# Patient Record
Sex: Female | Born: 1980 | Race: White | Hispanic: No | Marital: Married | Smoking: Never smoker
Health system: Southern US, Community
[De-identification: ages and names within clinical notes are randomized; demographics above are authoritative.]

## PROBLEM LIST (undated history)

## (undated) DIAGNOSIS — R112 Nausea with vomiting, unspecified: Secondary | ICD-10-CM

## (undated) DIAGNOSIS — M5136 Other intervertebral disc degeneration, lumbar region: Secondary | ICD-10-CM

## (undated) DIAGNOSIS — M51369 Other intervertebral disc degeneration, lumbar region without mention of lumbar back pain or lower extremity pain: Secondary | ICD-10-CM

## (undated) DIAGNOSIS — M47819 Spondylosis without myelopathy or radiculopathy, site unspecified: Secondary | ICD-10-CM

## (undated) DIAGNOSIS — M4310 Spondylolisthesis, site unspecified: Secondary | ICD-10-CM

## (undated) DIAGNOSIS — Z9889 Other specified postprocedural states: Secondary | ICD-10-CM

## (undated) DIAGNOSIS — M545 Low back pain, unspecified: Secondary | ICD-10-CM

## (undated) DIAGNOSIS — R200 Anesthesia of skin: Secondary | ICD-10-CM

## (undated) HISTORY — DX: Other intervertebral disc degeneration, lumbar region without mention of lumbar back pain or lower extremity pain: M51.369

## (undated) HISTORY — DX: Other intervertebral disc degeneration, lumbar region: M51.36

## (undated) HISTORY — DX: Spondylosis without myelopathy or radiculopathy, site unspecified: M47.819

## (undated) HISTORY — PX: CYST REMOVAL HAND: SHX6279

## (undated) HISTORY — DX: Spondylolisthesis, site unspecified: M43.10

## (undated) HISTORY — PX: BACK SURGERY: SHX140

## (undated) HISTORY — PX: WISDOM TOOTH EXTRACTION: SHX21

## (undated) HISTORY — DX: Anesthesia of skin: R20.0

## (undated) HISTORY — DX: Low back pain, unspecified: M54.50

---
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
## 2000-03-20 ENCOUNTER — Emergency Department (HOSPITAL_COMMUNITY): Admission: EM | Admit: 2000-03-20 | Discharge: 2000-03-21 | Payer: Self-pay

## 2002-02-14 ENCOUNTER — Encounter: Payer: Self-pay | Admitting: Emergency Medicine

## 2002-02-14 ENCOUNTER — Emergency Department (HOSPITAL_COMMUNITY): Admission: EM | Admit: 2002-02-14 | Discharge: 2002-02-14 | Payer: Self-pay | Admitting: Emergency Medicine

## 2002-03-15 ENCOUNTER — Emergency Department (HOSPITAL_COMMUNITY): Admission: EM | Admit: 2002-03-15 | Discharge: 2002-03-16 | Payer: Self-pay | Admitting: Emergency Medicine

## 2005-12-31 ENCOUNTER — Ambulatory Visit (HOSPITAL_COMMUNITY): Admission: RE | Admit: 2005-12-31 | Discharge: 2005-12-31 | Payer: Self-pay | Admitting: Neurosurgery

## 2018-10-30 ENCOUNTER — Other Ambulatory Visit: Payer: Self-pay | Admitting: Neurosurgery

## 2018-10-31 ENCOUNTER — Other Ambulatory Visit: Payer: Self-pay | Admitting: *Deleted

## 2018-11-02 ENCOUNTER — Encounter: Payer: Self-pay | Admitting: Vascular Surgery

## 2018-11-02 DIAGNOSIS — R200 Anesthesia of skin: Secondary | ICD-10-CM

## 2018-11-02 DIAGNOSIS — M545 Low back pain, unspecified: Secondary | ICD-10-CM

## 2018-11-08 ENCOUNTER — Ambulatory Visit: Payer: Self-pay | Admitting: Vascular Surgery

## 2018-11-08 ENCOUNTER — Other Ambulatory Visit: Payer: Self-pay | Admitting: *Deleted

## 2018-11-09 ENCOUNTER — Encounter: Payer: Self-pay | Admitting: Family

## 2018-11-14 ENCOUNTER — Telehealth (HOSPITAL_COMMUNITY): Payer: Self-pay | Admitting: Rehabilitation

## 2018-11-14 NOTE — Telephone Encounter (Signed)
The above patient or their representative was contacted and gave the following answers to these questions:         Do you have any of the following symptoms? No Fever                    Cough                   Shortness of breath  Do  you have any of the following other symptoms? No  muscle pain         vomiting,        diarrhea        rash         weakness        red eye        abdominal pain         bruising         bleeding              joint pain           severe headache  Have you been in contact with someone who was or has been sick in the past 2 weeks? No Yes                 Unsure                         Unable to assess   Does the person that you were in contact with have any of the following symptoms?  Cough         shortness of breath           muscle pain         vomiting,            diarrhea            rash            weakness           fever            red eye           abdominal pain          bruising  or  bleeding                joint pain                severe headache             Have you  or someone you have been in contact with traveled internationally in the last month?  No      If yes, which countries?  Have you  or someone you have been in contact with traveled outside [place] in the last month?  No      If yes, which state and city?  COMMENTS OR ACTION PLAN FOR THIS PATIENT:    

## 2018-11-15 ENCOUNTER — Ambulatory Visit (INDEPENDENT_AMBULATORY_CARE_PROVIDER_SITE_OTHER): Payer: BC Managed Care – PPO | Admitting: Vascular Surgery

## 2018-11-15 ENCOUNTER — Other Ambulatory Visit: Payer: Self-pay

## 2018-11-15 ENCOUNTER — Encounter: Payer: Self-pay | Admitting: Vascular Surgery

## 2018-11-15 VITALS — BP 123/88 | HR 100 | Temp 98.0°F | Resp 20 | Ht 64.0 in | Wt 130.7 lb

## 2018-11-15 DIAGNOSIS — M5136 Other intervertebral disc degeneration, lumbar region: Secondary | ICD-10-CM | POA: Diagnosis not present

## 2018-11-15 DIAGNOSIS — M5137 Other intervertebral disc degeneration, lumbosacral region: Secondary | ICD-10-CM

## 2018-11-15 NOTE — Progress Notes (Signed)
REASON FOR CONSULT:    To evaluate for retroperitoneal exposure of L5-S1.  The consult is requested by Dr. Annette Stable.  ASSESSMENT & PLAN:   DEGENERATIVE DISC DISEASE L5-S1: This patient appears to be a good candidate for anterior retroperitoneal exposure of L5-S1.  I reviewed her MRI and do not see any complicating features. I have reviewed our role in exposure of the spine in order to allow anterior lumbar interbody fusion at the appropriate levels. We have discussed the potential complications of surgery, including but not limited to, arterial or venous injury, thrombosis, or bleeding. We have also discussed the potential risks of wound healing problems, the development of a hernia, nerve injury, leg swelling, or other unpredictable medical problems. All the patient's questions were answered and they are agreeable to proceed.  Her surgery is scheduled for 11/21/2018.  [name], MD, FACS Beeper [phone] Office: [phone]   HPI:   Andrea Harper is a pleasant 38 y.o. female, with a long history of low back pain.  She played soccer in college and does not remember any specific injury but developed chronic low back pain.  She is had 2 previous operations through the back.  She is failed conservative treatment and now presents for anterior lumbar interbody fusion.  She has no risk factors for peripheral vascular disease.  Specifically, she denies diabetes, hypertension, hypercholesterolemia, any family history of premature cardiovascular disease or tobacco use.  She said no claudication or rest pain.  She has no cardiac history.  Past Medical History:  Diagnosis Date  . 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  Marked disc degeneration with disc space collapse.    . Disc degeneration, lumbar    Broad-based disc bulge at L5-S1 level.  Significant Foraminal stenosis.  . Facet hypertrophy    At L4-5 with mild canal and Bilat L5 foraminal stenosis.   . Low back pain   . Numbness     and pain Into Left buttocks, posterior thigh, and posterior lateral leg.    Marland Kitchen Retrolisthesis    4 mm of L5 S1, Grade 1.  More prominent with extension.    History reviewed. No pertinent family history.  SOCIAL HISTORY: She is not a smoker. Social History   Socioeconomic History  . Marital status: Married    Spouse name: Not on file  . Number of children: Not on file  . Years of education: Not on file  . Highest education level: Not on file  Occupational History  . 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 Financial resource strain: Not on file  . Food insecurity    Worry: Not on file    Inability: Not on file  . Transportation needs    Medical: Not on file    Non-medical: Not on file  Tobacco Use  . Smoking status: Never Smoker  . Smokeless tobacco: Never Used  Substance and Sexual Activity  . Alcohol use: Not Currently  . Drug use: Not Currently  . Sexual activity: Not on file  Lifestyle  . Physical activity    Days per week: Not on file    Minutes per session: Not on file  . Stress: Not on file  Relationships  . Social Herbalist on phone: Not on file    Gets together: Not on file    Attends religious service: Not on file    Active member of club or organization: Not on file  Attends meetings of clubs or organizations: Not on file    Relationship status: Not on file  . Intimate partner violence    Fear of current or ex partner: Not on file    Emotionally abused: Not on file    Physically abused: Not on file    Forced sexual activity: Not on file  Other Topics Concern  . Not on file  Social History Narrative  . Not on file    Allergies  Allergen Reactions  . Erythromycin Base     Childhood allergy, unknown reaction    Current Outpatient Medications  Medication Sig Dispense Refill  . Biotin 5 MG TABS Take 5 mg by mouth daily.    . cyclobenzaprine (FLEXERIL) 10 MG tablet Take 10 mg by mouth 3 (three) times  daily as needed.     . Multiple Vitamin (MULTIVITAMIN WITH MINERALS) TABS tablet Take 1 tablet by mouth daily.     No current facility-administered medications for this visit.     REVIEW OF SYSTEMS:  [X]  denotes positive finding, [ ]  denotes negative finding Cardiac  Comments:  Chest pain or chest pressure:    Shortness of breath upon exertion:    Short of breath when lying flat:    Irregular heart rhythm:        Vascular    Pain in calf, thigh, or hip brought on by ambulation:    Pain in feet at night that wakes you up from your sleep:     Blood clot in your veins:    Leg swelling:         Pulmonary    Oxygen at home:    Productive cough:     Wheezing:         Neurologic    Sudden weakness in arms or legs:     Sudden numbness in arms or legs:     Sudden onset of difficulty speaking or slurred speech:    Temporary loss of vision in one eye:     Problems with dizziness:         Gastrointestinal    Blood in stool:     Vomited blood:         Genitourinary    Burning when urinating:     Blood in urine:        Psychiatric    Major depression:         Hematologic    Bleeding problems:    Problems with blood clotting too easily:        Skin    Rashes or ulcers:        Constitutional    Fever or chills:     PHYSICAL EXAM:   Vitals:   11/15/18 0845  BP: 123/88  Pulse: 100  Resp: 20  Temp: 98 F (36.7 C)  SpO2: 100%  Weight: 130 lb 11.2 oz (59.3 kg)  Height: 5\' 4"  (1.626 m)    GENERAL: The patient is a well-nourished female, in no acute distress. The vital signs are documented above. CARDIAC: There is a regular rate and rhythm.  VASCULAR: I do not detect carotid bruits. She has palpable dorsalis pedis and posterior tibial pulses bilaterally. She has no significant lower extremity swelling. PULMONARY: There is good air exchange bilaterally without wheezing or rales. ABDOMEN: Soft and non-tender with normal pitched bowel sounds.  MUSCULOSKELETAL: There  are no major deformities or cyanosis. NEUROLOGIC: No focal weakness or paresthesias are detected. SKIN: There are no ulcers or rashes  noted. PSYCHIATRIC: The patient has a normal affect.  DATA:    MRI: She brought a disc with her MRI today and I reviewed these images with her.  I do not see any complicating factors as related to exposure of the L5-S1 disc space.

## 2018-11-15 NOTE — H&P (View-Only) (Signed)
REASON FOR CONSULT:    To evaluate for retroperitoneal exposure of L5-S1.  The consult is requested by Dr. Annette Stable.  ASSESSMENT & PLAN:   DEGENERATIVE DISC DISEASE L5-S1: This patient appears to be a good candidate for anterior retroperitoneal exposure of L5-S1.  I reviewed her MRI and do not see any complicating features. I have reviewed our role in exposure of the spine in order to allow anterior lumbar interbody fusion at the appropriate levels. We have discussed the potential complications of surgery, including but not limited to, arterial or venous injury, thrombosis, or bleeding. We have also discussed the potential risks of wound healing problems, the development of a hernia, nerve injury, leg swelling, or other unpredictable medical problems. All the patient's questions were answered and they are agreeable to proceed.  Her surgery is scheduled for 11/21/2018.  [name], MD, FACS Beeper [phone] Office: [phone]   HPI:   Andrea Harper is a pleasant 38 y.o. female, with a long history of low back pain.  She played soccer in college and does not remember any specific injury but developed chronic low back pain.  She is had 2 previous operations through the back.  She is failed conservative treatment and now presents for anterior lumbar interbody fusion.  She has no risk factors for peripheral vascular disease.  Specifically, she denies diabetes, hypertension, hypercholesterolemia, any family history of premature cardiovascular disease or tobacco use.  She said no claudication or rest pain.  She has no cardiac history.  Past Medical History:  Diagnosis Date  . 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  Marked disc degeneration with disc space collapse.    . Disc degeneration, lumbar    Broad-based disc bulge at L5-S1 level.  Significant Foraminal stenosis.  . Facet hypertrophy    At L4-5 with mild canal and Bilat L5 foraminal stenosis.   . Low back pain   . Numbness     and pain Into Left buttocks, posterior thigh, and posterior lateral leg.    Marland Kitchen Retrolisthesis    4 mm of L5 S1, Grade 1.  More prominent with extension.    History reviewed. No pertinent family history.  SOCIAL HISTORY: She is not a smoker. Social History   Socioeconomic History  . Marital status: Married    Spouse name: Not on file  . Number of children: Not on file  . Years of education: Not on file  . Highest education level: Not on file  Occupational History  . 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 Financial resource strain: Not on file  . Food insecurity    Worry: Not on file    Inability: Not on file  . Transportation needs    Medical: Not on file    Non-medical: Not on file  Tobacco Use  . Smoking status: Never Smoker  . Smokeless tobacco: Never Used  Substance and Sexual Activity  . Alcohol use: Not Currently  . Drug use: Not Currently  . Sexual activity: Not on file  Lifestyle  . Physical activity    Days per week: Not on file    Minutes per session: Not on file  . Stress: Not on file  Relationships  . Social Herbalist on phone: Not on file    Gets together: Not on file    Attends religious service: Not on file    Active member of club or organization: Not on file  Attends meetings of clubs or organizations: Not on file    Relationship status: Not on file  . Intimate partner violence    Fear of current or ex partner: Not on file    Emotionally abused: Not on file    Physically abused: Not on file    Forced sexual activity: Not on file  Other Topics Concern  . Not on file  Social History Narrative  . Not on file    Allergies  Allergen Reactions  . Erythromycin Base     Childhood allergy, unknown reaction    Current Outpatient Medications  Medication Sig Dispense Refill  . Biotin 5 MG TABS Take 5 mg by mouth daily.    . cyclobenzaprine (FLEXERIL) 10 MG tablet Take 10 mg by mouth 3 (three) times  daily as needed.     . Multiple Vitamin (MULTIVITAMIN WITH MINERALS) TABS tablet Take 1 tablet by mouth daily.     No current facility-administered medications for this visit.     REVIEW OF SYSTEMS:  [X]  denotes positive finding, [ ]  denotes negative finding Cardiac  Comments:  Chest pain or chest pressure:    Shortness of breath upon exertion:    Short of breath when lying flat:    Irregular heart rhythm:        Vascular    Pain in calf, thigh, or hip brought on by ambulation:    Pain in feet at night that wakes you up from your sleep:     Blood clot in your veins:    Leg swelling:         Pulmonary    Oxygen at home:    Productive cough:     Wheezing:         Neurologic    Sudden weakness in arms or legs:     Sudden numbness in arms or legs:     Sudden onset of difficulty speaking or slurred speech:    Temporary loss of vision in one eye:     Problems with dizziness:         Gastrointestinal    Blood in stool:     Vomited blood:         Genitourinary    Burning when urinating:     Blood in urine:        Psychiatric    Major depression:         Hematologic    Bleeding problems:    Problems with blood clotting too easily:        Skin    Rashes or ulcers:        Constitutional    Fever or chills:     PHYSICAL EXAM:   Vitals:   11/15/18 0845  BP: 123/88  Pulse: 100  Resp: 20  Temp: 98 F (36.7 C)  SpO2: 100%  Weight: 130 lb 11.2 oz (59.3 kg)  Height: 5\' 4"  (1.626 m)    GENERAL: The patient is a well-nourished female, in no acute distress. The vital signs are documented above. CARDIAC: There is a regular rate and rhythm.  VASCULAR: I do not detect carotid bruits. She has palpable dorsalis pedis and posterior tibial pulses bilaterally. She has no significant lower extremity swelling. PULMONARY: There is good air exchange bilaterally without wheezing or rales. ABDOMEN: Soft and non-tender with normal pitched bowel sounds.  MUSCULOSKELETAL: There  are no major deformities or cyanosis. NEUROLOGIC: No focal weakness or paresthesias are detected. SKIN: There are no ulcers or rashes  noted. PSYCHIATRIC: The patient has a normal affect.  DATA:    MRI: She brought a disc with her MRI today and I reviewed these images with her.  I do not see any complicating factors as related to exposure of the L5-S1 disc space.   

## 2018-11-16 NOTE — Progress Notes (Signed)
[id number] - [place], [place] - [address] [place] [id number] Phone: [phone] Fax: [phone]      Your procedure is scheduled on Tuesday, 6/23.  [facility] Main Entr[facility] "A" at 5:30 A.M., and check in at the Admitting office.  Call this number if you have problems the morning of surgery:  [phone]  Call [phone] if you have any questions prior to your surgery date Monday-Friday 8am-4pm    Remember:  Do not eat or drink after midnight Monday.    Take these medicines the morning of surgery with A SIP OF WATER: None   STOP now taking any Aspirin (unless otherwise instructed by your surgeon), Aleve, Naproxen, Ibuprofen, Motrin, Advil, Goody's, BC's, all herbal medications, fish oil, and all vitamins.    The Morning of Surgery  Do not wear jewelry, make-up or nail polish.  Do not wear lotions, powders, or perfumes/colognes, or deodorant  Do not shave 48 hours prior to surgery.    Do not bring valuables to the hospital.  [facility] is not responsible for any belongings or valuables.  If you are a smoker, DO NOT Smoke 24 hours prior to surgery   IF you wear a CPAP at night please bring your mask, tubing, and machine the morning of surgery   Remember that you must have someone to transport you home after your surgery, and remain with you for 24 hours if you are discharged the same day.  Patients discharged the day of surgery will not be allowed to drive home.   Contacts, glasses, hearing aids, dentures or bridgework may not be worn into surgery.   For patients admitted to the hospital, discharge time will be determined by your treatment team.   Special instructions:   [place]- Preparing For Surgery  Before surgery, you can play an important role. Because skin is not sterile, your skin needs to be as free of germs as possible. You can reduce the number of germs on your  skin by washing with CHG (chlorahexidine gluconate) Soap before surgery.  CHG is an antiseptic cleaner which kills germs and bonds with the skin to continue killing germs even after washing.    Oral Hygiene is also important to reduce your risk of infection.  Remember - BRUSH YOUR TEETH THE MORNING OF SURGERY WITH YOUR REGULAR TOOTHPASTE  Please do not use if you have an allergy to CHG or antibacterial soaps. If your skin becomes reddened/irritated stop using the CHG.  Do not shave (including legs and underarms) for at least 48 hours prior to first CHG shower. It is OK to shave your face.  Please follow these instructions carefully.   1. Shower the Barnes & NobleGHT BEFORE SURGERY (Mon) and the MORNING OF SURGERY (Tues) with CHG Soap.   2. If you chose to wash your hair, wash your hair first as usual with your normal shampoo.  3. After you shampoo, rinse your hair and body thoroughly to remove the shampoo.  4. Use CHG as you would any other liquid soap. You can apply CHG directly to the skin and wash gently with a scrungie or a clean washcloth.   5. Apply the CHG Soap to your body ONLY FROM THE NECK DOWN.  Do not use on open wounds or open sores. Avoid contact with your eyes, ears, mouth and genitals (private parts). Wash Face and genitals (private parts)  with your  normal soap.   6. Wash thoroughly, paying special attention to the area where your surgery will be performed.  7. Thoroughly rinse your body with warm water from the neck down.  8. DO NOT shower/wash with your normal soap after using and rinsing off the CHG Soap.  9. Pat yourself dry with a CLEAN TOWEL.  10. Wear CLEAN PAJAMAS to bed the night before surgery, wear comfortable clothes the morning of surgery  11. Place CLEAN SHEETS on your bed the night of your first shower and DO NOT SLEEP WITH PETS.    Day of Surgery:  Do not apply any deodorants/lotions.  Please wear clean clothes to the hospital/surgery center.   Remember to  brush your teeth WITH YOUR REGULAR TOOTHPASTE.   Please read over the following fact sheets that you were given.

## 2018-11-17 ENCOUNTER — Ambulatory Visit (HOSPITAL_COMMUNITY)
Admission: RE | Admit: 2018-11-17 | Discharge: 2018-11-17 | Disposition: A | Discharge status: Home / Self Care | Payer: BC Managed Care – PPO | Source: Ambulatory Visit | Attending: Neurosurgery | Admitting: Neurosurgery

## 2018-11-17 ENCOUNTER — Other Ambulatory Visit: Payer: Self-pay

## 2018-11-17 ENCOUNTER — Other Ambulatory Visit (HOSPITAL_COMMUNITY)
Admission: RE | Admit: 2018-11-17 | Discharge: 2018-11-17 | Disposition: A | Discharge status: Home / Self Care | Payer: BC Managed Care – PPO | Source: Ambulatory Visit | Attending: Neurosurgery | Admitting: Neurosurgery

## 2018-11-17 ENCOUNTER — Encounter (HOSPITAL_COMMUNITY)
Admission: RE | Admit: 2018-11-17 | Discharge: 2018-11-17 | Disposition: A | Discharge status: Home / Self Care | Payer: BC Managed Care – PPO | Source: Ambulatory Visit | Attending: Neurosurgery | Admitting: Neurosurgery

## 2018-11-17 ENCOUNTER — Encounter (HOSPITAL_COMMUNITY): Payer: Self-pay

## 2018-11-17 DIAGNOSIS — Z1159 Encounter for screening for other viral diseases: Secondary | ICD-10-CM | POA: Diagnosis not present

## 2018-11-17 DIAGNOSIS — M4310 Spondylolisthesis, site unspecified: Secondary | ICD-10-CM | POA: Diagnosis not present

## 2018-11-17 HISTORY — DX: Other specified postprocedural states: R11.2

## 2018-11-17 HISTORY — DX: Other specified postprocedural states: Z98.890

## 2018-11-17 LAB — CBC WITH DIFFERENTIAL/PLATELET
Abs Immature Granulocytes: 0.01 10*3/uL (ref 0.00–0.07)
Basophils Absolute: 0 10*3/uL (ref 0.0–0.1)
Basophils Relative: 0 %
Eosinophils Absolute: 0.1 10*3/uL (ref 0.0–0.5)
Eosinophils Relative: 1 %
HCT: 39.5 % (ref 36.0–46.0)
Hemoglobin: 13.3 g/dL (ref 12.0–15.0)
Immature Granulocytes: 0 %
Lymphocytes Relative: 27 %
Lymphs Abs: 1.9 10*3/uL (ref 0.7–4.0)
MCH: 30 pg (ref 26.0–34.0)
MCHC: 33.7 g/dL (ref 30.0–36.0)
MCV: 89.2 fL (ref 80.0–100.0)
Monocytes Absolute: 0.6 10*3/uL (ref 0.1–1.0)
Monocytes Relative: 9 %
Neutro Abs: 4.4 10*3/uL (ref 1.7–7.7)
Neutrophils Relative %: 63 %
Platelets: 253 10*3/uL (ref 150–400)
RBC: 4.43 MIL/uL (ref 3.87–5.11)
RDW: 11.8 % (ref 11.5–15.5)
WBC: 7.1 10*3/uL (ref 4.0–10.5)
nRBC: 0 % (ref 0.0–0.2)

## 2018-11-17 LAB — SURGICAL PCR SCREEN
MRSA, PCR: NEGATIVE
Staphylococcus aureus: NEGATIVE

## 2018-11-17 LAB — TYPE AND SCREEN
ABO/RH(D): A POS
Antibody Screen: NEGATIVE

## 2018-11-17 LAB — SARS CORONAVIRUS 2 (TAT 6-24 HRS): SARS Coronavirus 2: NEGATIVE

## 2018-11-17 NOTE — Progress Notes (Addendum)
PCP - Polo Riley, OBGYN Cardiologist - patient denies  Chest x-ray - 11/17/2018 EKG - 11/17/2018 Stress Test - patient denies ECHO - patient denies Cardiac Cath - patient denies  Sleep Study - patient denies CPAP -   Fasting Blood Sugar - patient denies Checks Blood Sugar _____ times a day  Blood Thinner Instructions: n/a Aspirin Instructions:n/a  Anesthesia review: n/a  Patient denies shortness of breath, fever, cough and chest pain at PAT appointment. Patient's HR was slightly elevated in the 100's.  HR 104 on EKG.  Jeneen Rinks, APP notified.  Coronavirus Screening  Have you experienced the following symptoms:  Cough yes/no: No Fever (>100.50F)  yes/no: No Runny nose yes/no: No Sore throat yes/no: No Difficulty breathing/shortness of breath  yes/no: No  Have you or a family member traveled in the last 14 days and where? yes/no: No   If the patient indicates "YES" to the above questions, their PAT will be rescheduled to limit the exposure to others and, the surgeon will be notified. THE PATIENT WILL NEED TO BE ASYMPTOMATIC FOR 14 DAYS.   If the patient is not experiencing any of these symptoms, the PAT nurse will instruct them to NOT bring anyone with them to their appointment since they may have these symptoms or traveled as well.   Please remind your patients and families that hospital visitation restrictions are in effect and the importance of the restrictions.    Patient verbalized understanding of instructions that were given to them at the PAT appointment. Patient was also instructed that they will need to review over the PAT instructions again at home before surgery.

## 2018-11-21 ENCOUNTER — Inpatient Hospital Stay (HOSPITAL_COMMUNITY): Payer: BC Managed Care – PPO | Admitting: Physician Assistant

## 2018-11-21 ENCOUNTER — Inpatient Hospital Stay (HOSPITAL_COMMUNITY): Payer: BC Managed Care – PPO

## 2018-11-21 ENCOUNTER — Encounter (HOSPITAL_COMMUNITY)
Admission: RE | Disposition: A | Discharge status: Home / Self Care | Payer: Self-pay | Source: Home / Self Care | Attending: Neurosurgery

## 2018-11-21 ENCOUNTER — Encounter (HOSPITAL_COMMUNITY): Payer: Self-pay

## 2018-11-21 ENCOUNTER — Inpatient Hospital Stay (HOSPITAL_COMMUNITY)
Admission: RE | Admit: 2018-11-21 | Discharge: 2018-11-22 | DRG: 460 | Disposition: A | Discharge status: Home / Self Care | Payer: BC Managed Care – PPO | Attending: Neurosurgery | Admitting: Neurosurgery

## 2018-11-21 ENCOUNTER — Other Ambulatory Visit: Payer: Self-pay

## 2018-11-21 ENCOUNTER — Inpatient Hospital Stay (HOSPITAL_COMMUNITY): Payer: BC Managed Care – PPO | Admitting: Certified Registered Nurse Anesthetist

## 2018-11-21 DIAGNOSIS — M4317 Spondylolisthesis, lumbosacral region: Principal | ICD-10-CM | POA: Diagnosis present

## 2018-11-21 DIAGNOSIS — Z881 Allergy status to other antibiotic agents status: Secondary | ICD-10-CM | POA: Diagnosis not present

## 2018-11-21 DIAGNOSIS — M4310 Spondylolisthesis, site unspecified: Secondary | ICD-10-CM | POA: Diagnosis present

## 2018-11-21 DIAGNOSIS — M4807 Spinal stenosis, lumbosacral region: Secondary | ICD-10-CM | POA: Diagnosis present

## 2018-11-21 DIAGNOSIS — Z79899 Other long term (current) drug therapy: Secondary | ICD-10-CM

## 2018-11-21 DIAGNOSIS — Z419 Encounter for procedure for purposes other than remedying health state, unspecified: Secondary | ICD-10-CM

## 2018-11-21 HISTORY — PX: ANTERIOR LUMBAR FUSION: SHX1170

## 2018-11-21 HISTORY — PX: ABDOMINAL EXPOSURE: SHX5708

## 2018-11-21 LAB — POCT PREGNANCY, URINE: Preg Test, Ur: NEGATIVE

## 2018-11-21 SURGERY — ANTERIOR LUMBAR FUSION 1 LEVEL
Anesthesia: General | Site: Abdomen

## 2018-11-21 MED ORDER — MIDAZOLAM HCL 5 MG/5ML IJ SOLN
INTRAMUSCULAR | Status: DC | PRN
  Administered 2018-11-21: 2 mg via INTRAVENOUS

## 2018-11-21 MED ORDER — DIAZEPAM 5 MG PO TABS
5.0000 mg | ORAL_TABLET | Freq: Four times a day (QID) | ORAL | Status: DC | PRN
  Administered 2018-11-21 – 2018-11-22 (×4): 5 mg via ORAL
  Filled 2018-11-21 (×3): qty 1

## 2018-11-21 MED ORDER — SODIUM CHLORIDE 0.9 % IV SOLN
INTRAVENOUS | Status: DC | PRN
  Administered 2018-11-21: 09:00:00 40 ug/min via INTRAVENOUS

## 2018-11-21 MED ORDER — LACTATED RINGERS IV SOLN
INTRAVENOUS | Status: DC
  Administered 2018-11-21: 07:00:00 via INTRAVENOUS

## 2018-11-21 MED ORDER — EPHEDRINE SULFATE 50 MG/ML IJ SOLN
INTRAMUSCULAR | Status: DC | PRN
  Administered 2018-11-21: 10 mg via INTRAVENOUS

## 2018-11-21 MED ORDER — SUCCINYLCHOLINE CHLORIDE 200 MG/10ML IV SOSY
PREFILLED_SYRINGE | INTRAVENOUS | Status: AC
  Filled 2018-11-21: qty 10

## 2018-11-21 MED ORDER — DEXAMETHASONE SODIUM PHOSPHATE 10 MG/ML IJ SOLN
INTRAMUSCULAR | Status: DC | PRN
  Administered 2018-11-21: 10 mg via INTRAVENOUS

## 2018-11-21 MED ORDER — LIDOCAINE HCL (CARDIAC) PF 100 MG/5ML IV SOSY
PREFILLED_SYRINGE | INTRAVENOUS | Status: DC | PRN
  Administered 2018-11-21: 60 mg via INTRAVENOUS

## 2018-11-21 MED ORDER — THROMBIN 20000 UNITS EX SOLR
CUTANEOUS | Status: AC
  Filled 2018-11-21: qty 20000

## 2018-11-21 MED ORDER — BISACODYL 10 MG RE SUPP: 10.0000 mg | Freq: Every day | RECTAL | Status: DC | PRN

## 2018-11-21 MED ORDER — CEFAZOLIN SODIUM-DEXTROSE 1-4 GM/50ML-% IV SOLN
1.0000 g | Freq: Three times a day (TID) | INTRAVENOUS | Status: AC
  Administered 2018-11-21 (×2): 1 g via INTRAVENOUS
  Filled 2018-11-21 (×2): qty 50

## 2018-11-21 MED ORDER — FENTANYL CITRATE (PF) 250 MCG/5ML IJ SOLN
INTRAMUSCULAR | Status: AC
  Filled 2018-11-21: qty 5

## 2018-11-21 MED ORDER — SODIUM CHLORIDE 0.9% FLUSH: 3.0000 mL | INTRAVENOUS | Status: DC | PRN

## 2018-11-21 MED ORDER — SUGAMMADEX SODIUM 200 MG/2ML IV SOLN
INTRAVENOUS | Status: DC | PRN
  Administered 2018-11-21: 200 mg via INTRAVENOUS

## 2018-11-21 MED ORDER — SODIUM CHLORIDE 0.9 % IV SOLN: 250.0000 mL | INTRAVENOUS | Status: DC

## 2018-11-21 MED ORDER — HYDROCODONE-ACETAMINOPHEN 10-325 MG PO TABS: 1.0000 | ORAL_TABLET | ORAL | Status: DC | PRN

## 2018-11-21 MED ORDER — OXYCODONE HCL 5 MG PO TABS
10.0000 mg | ORAL_TABLET | ORAL | Status: DC | PRN
  Administered 2018-11-21 – 2018-11-22 (×7): 10 mg via ORAL
  Filled 2018-11-21 (×6): qty 2

## 2018-11-21 MED ORDER — DEXAMETHASONE SODIUM PHOSPHATE 10 MG/ML IJ SOLN: 10.0000 mg | INTRAMUSCULAR | Status: DC

## 2018-11-21 MED ORDER — ACETAMINOPHEN 650 MG RE SUPP: 650.0000 mg | RECTAL | Status: DC | PRN

## 2018-11-21 MED ORDER — ONDANSETRON HCL 4 MG/2ML IJ SOLN: 4.0000 mg | Freq: Four times a day (QID) | INTRAMUSCULAR | Status: DC | PRN

## 2018-11-21 MED ORDER — PHENYLEPHRINE HCL (PRESSORS) 10 MG/ML IV SOLN
INTRAVENOUS | Status: DC | PRN
  Administered 2018-11-21 (×3): 80 ug via INTRAVENOUS
  Administered 2018-11-21: 120 ug via INTRAVENOUS

## 2018-11-21 MED ORDER — SCOPOLAMINE 1 MG/3DAYS TD PT72
MEDICATED_PATCH | TRANSDERMAL | Status: DC | PRN
  Administered 2018-11-21: 1 via TRANSDERMAL

## 2018-11-21 MED ORDER — FENTANYL CITRATE (PF) 100 MCG/2ML IJ SOLN
INTRAMUSCULAR | Status: DC | PRN
  Administered 2018-11-21 (×2): 100 ug via INTRAVENOUS
  Administered 2018-11-21 (×2): 50 ug via INTRAVENOUS

## 2018-11-21 MED ORDER — HYDROMORPHONE HCL 1 MG/ML IJ SOLN
INTRAMUSCULAR | Status: AC
  Filled 2018-11-21: qty 1

## 2018-11-21 MED ORDER — EPHEDRINE 5 MG/ML INJ
INTRAVENOUS | Status: AC
  Filled 2018-11-21: qty 10

## 2018-11-21 MED ORDER — THROMBIN 20000 UNITS EX SOLR
CUTANEOUS | Status: DC | PRN
  Administered 2018-11-21: 09:00:00 20 mL via TOPICAL

## 2018-11-21 MED ORDER — SODIUM CHLORIDE 0.9 % IV SOLN
INTRAVENOUS | Status: DC | PRN
  Administered 2018-11-21: 500 mL

## 2018-11-21 MED ORDER — OXYCODONE HCL 5 MG PO TABS
ORAL_TABLET | ORAL | Status: AC
  Filled 2018-11-21: qty 2

## 2018-11-21 MED ORDER — CEFAZOLIN SODIUM-DEXTROSE 2-4 GM/100ML-% IV SOLN
2.0000 g | INTRAVENOUS | Status: AC
  Administered 2018-11-21: 08:00:00 2 g via INTRAVENOUS

## 2018-11-21 MED ORDER — ONDANSETRON HCL 4 MG/2ML IJ SOLN: 4.0000 mg | Freq: Once | INTRAMUSCULAR | Status: DC | PRN

## 2018-11-21 MED ORDER — HYDROMORPHONE HCL 1 MG/ML IJ SOLN: 1.0000 mg | INTRAMUSCULAR | Status: DC | PRN

## 2018-11-21 MED ORDER — THROMBIN 5000 UNITS EX SOLR
CUTANEOUS | Status: AC
  Filled 2018-11-21: qty 5000

## 2018-11-21 MED ORDER — PHENYLEPHRINE 40 MCG/ML (10ML) SYRINGE FOR IV PUSH (FOR BLOOD PRESSURE SUPPORT)
PREFILLED_SYRINGE | INTRAVENOUS | Status: AC
  Filled 2018-11-21: qty 10

## 2018-11-21 MED ORDER — DIAZEPAM 5 MG PO TABS
ORAL_TABLET | ORAL | Status: AC
  Filled 2018-11-21: qty 1

## 2018-11-21 MED ORDER — MEPERIDINE HCL 25 MG/ML IJ SOLN: 6.2500 mg | INTRAMUSCULAR | Status: DC | PRN

## 2018-11-21 MED ORDER — CHLORHEXIDINE GLUCONATE 4 % EX LIQD: 60.0000 mL | Freq: Once | CUTANEOUS | Status: DC

## 2018-11-21 MED ORDER — DEXAMETHASONE SODIUM PHOSPHATE 10 MG/ML IJ SOLN
INTRAMUSCULAR | Status: AC
  Filled 2018-11-21: qty 1

## 2018-11-21 MED ORDER — POLYETHYLENE GLYCOL 3350 17 G PO PACK
17.0000 g | PACK | Freq: Every day | ORAL | Status: DC | PRN
  Administered 2018-11-22: 17 g via ORAL
  Filled 2018-11-21: qty 1

## 2018-11-21 MED ORDER — BUPIVACAINE HCL (PF) 0.25 % IJ SOLN
INTRAMUSCULAR | Status: DC | PRN
  Administered 2018-11-21: 30 mL

## 2018-11-21 MED ORDER — ONDANSETRON HCL 4 MG/2ML IJ SOLN
INTRAMUSCULAR | Status: DC | PRN
  Administered 2018-11-21: 4 mg via INTRAVENOUS

## 2018-11-21 MED ORDER — PROPOFOL 10 MG/ML IV BOLUS
INTRAVENOUS | Status: DC | PRN
  Administered 2018-11-21: 120 mg via INTRAVENOUS

## 2018-11-21 MED ORDER — PROMETHAZINE HCL 25 MG/ML IJ SOLN
25.0000 mg | INTRAMUSCULAR | Status: AC
  Administered 2018-11-21: 12.5 mg via INTRAVENOUS
  Filled 2018-11-21: qty 1

## 2018-11-21 MED ORDER — FLEET ENEMA 7-19 GM/118ML RE ENEM: 1.0000 | ENEMA | Freq: Once | RECTAL | Status: DC | PRN

## 2018-11-21 MED ORDER — MENTHOL 3 MG MT LOZG: 1.0000 | LOZENGE | OROMUCOSAL | Status: DC | PRN

## 2018-11-21 MED ORDER — CHLORHEXIDINE GLUCONATE CLOTH 2 % EX PADS: 6.0000 | MEDICATED_PAD | Freq: Once | CUTANEOUS | Status: DC

## 2018-11-21 MED ORDER — SODIUM CHLORIDE 0.9% FLUSH
3.0000 mL | Freq: Two times a day (BID) | INTRAVENOUS | Status: DC
  Administered 2018-11-21: 3 mL via INTRAVENOUS

## 2018-11-21 MED ORDER — PHENOL 1.4 % MT LIQD: 1.0000 | OROMUCOSAL | Status: DC | PRN

## 2018-11-21 MED ORDER — ONDANSETRON HCL 4 MG/2ML IJ SOLN
INTRAMUSCULAR | Status: AC
  Filled 2018-11-21: qty 2

## 2018-11-21 MED ORDER — PROPOFOL 10 MG/ML IV BOLUS
INTRAVENOUS | Status: AC
  Filled 2018-11-21: qty 20

## 2018-11-21 MED ORDER — ROCURONIUM BROMIDE 100 MG/10ML IV SOLN
INTRAVENOUS | Status: DC | PRN
  Administered 2018-11-21: 80 mg via INTRAVENOUS

## 2018-11-21 MED ORDER — MIDAZOLAM HCL 2 MG/2ML IJ SOLN
INTRAMUSCULAR | Status: AC
  Filled 2018-11-21: qty 2

## 2018-11-21 MED ORDER — BIOTIN 5 MG PO TABS: 5.0000 mg | ORAL_TABLET | Freq: Every day | ORAL | Status: DC

## 2018-11-21 MED ORDER — ONDANSETRON HCL 4 MG PO TABS: 4.0000 mg | ORAL_TABLET | Freq: Four times a day (QID) | ORAL | Status: DC | PRN

## 2018-11-21 MED ORDER — BUPIVACAINE HCL (PF) 0.25 % IJ SOLN
INTRAMUSCULAR | Status: AC
  Filled 2018-11-21: qty 30

## 2018-11-21 MED ORDER — ACETAMINOPHEN 325 MG PO TABS: 650.0000 mg | ORAL_TABLET | ORAL | Status: DC | PRN

## 2018-11-21 MED ORDER — HYDROMORPHONE HCL 1 MG/ML IJ SOLN
0.2500 mg | INTRAMUSCULAR | Status: DC | PRN
  Administered 2018-11-21 (×2): 0.5 mg via INTRAVENOUS

## 2018-11-21 MED ORDER — LIDOCAINE 2% (20 MG/ML) 5 ML SYRINGE
INTRAMUSCULAR | Status: AC
  Filled 2018-11-21: qty 5

## 2018-11-21 MED ORDER — 0.9 % SODIUM CHLORIDE (POUR BTL) OPTIME
TOPICAL | Status: DC | PRN
  Administered 2018-11-21: 1000 mL

## 2018-11-21 MED ORDER — ADULT MULTIVITAMIN W/MINERALS CH
1.0000 | ORAL_TABLET | Freq: Every day | ORAL | Status: DC
  Administered 2018-11-22: 1 via ORAL
  Filled 2018-11-21: qty 1

## 2018-11-21 SURGICAL SUPPLY — 90 items
ADH SKN CLS APL DERMABOND .7 (GAUZE/BANDAGES/DRESSINGS) ×1
ANCH SPNL 25 LMBR MIS (Anchor) ×3 IMPLANT
ANCHOR LUMBAR 25 MIS (Anchor) ×3 IMPLANT
APL SKNCLS STERI-STRIP NONHPOA (GAUZE/BANDAGES/DRESSINGS) ×1
APPLIER CLIP 11 MED OPEN (CLIP) ×2
APR CLP MED 11 20 MLT OPN (CLIP) ×1
BAG DECANTER FOR FLEXI CONT (MISCELLANEOUS) ×2 IMPLANT
BENZOIN TINCTURE PRP APPL 2/3 (GAUZE/BANDAGES/DRESSINGS) ×1 IMPLANT
BUR MATCHSTICK NEURO 3.0 LAGG (BURR) ×1 IMPLANT
CANISTER SUCT 3000ML PPV (MISCELLANEOUS) ×2 IMPLANT
CARTRIDGE OIL MAESTRO DRILL (MISCELLANEOUS) ×1 IMPLANT
CLIP APPLIE 11 MED OPEN (CLIP) ×2 IMPLANT
CLSR STERI-STRIP ANTIMIC 1/2X4 (GAUZE/BANDAGES/DRESSINGS) ×1 IMPLANT
COVER BACK TABLE 60X90IN (DRAPES) ×2 IMPLANT
COVER WAND RF STERILE (DRAPES) ×3 IMPLANT
DERMABOND ADVANCED (GAUZE/BANDAGES/DRESSINGS) ×1
DERMABOND ADVANCED .7 DNX12 (GAUZE/BANDAGES/DRESSINGS) ×1 IMPLANT
DIFFUSER DRILL AIR PNEUMATIC (MISCELLANEOUS) ×2 IMPLANT
DRAPE C-ARM 42X72 X-RAY (DRAPES) ×5 IMPLANT
DRAPE INCISE IOBAN 66X45 STRL (DRAPES) ×2 IMPLANT
DRAPE LAPAROTOMY 100X72X124 (DRAPES) ×2 IMPLANT
DRSG OPSITE POSTOP 4X6 (GAUZE/BANDAGES/DRESSINGS) ×1 IMPLANT
ELECT BLADE 4.0 EZ CLEAN MEGAD (MISCELLANEOUS) ×2
ELECT BLADE 6.5 EXT (BLADE) ×2 IMPLANT
ELECT REM PT RETURN 9FT ADLT (ELECTROSURGICAL) ×2
ELECTRODE BLDE 4.0 EZ CLN MEGD (MISCELLANEOUS) IMPLANT
ELECTRODE REM PT RTRN 9FT ADLT (ELECTROSURGICAL) ×1 IMPLANT
GAUZE 4X4 16PLY RFD (DISPOSABLE) IMPLANT
GAUZE SPONGE 4X4 12PLY STRL (GAUZE/BANDAGES/DRESSINGS) ×1 IMPLANT
GLOVE BIOGEL PI IND STRL 7.0 (GLOVE) IMPLANT
GLOVE BIOGEL PI IND STRL 8 (GLOVE) ×1 IMPLANT
GLOVE BIOGEL PI INDICATOR 7.0 (GLOVE) ×2
GLOVE BIOGEL PI INDICATOR 8 (GLOVE) ×3
GLOVE ECLIPSE 7.5 STRL STRAW (GLOVE) ×6 IMPLANT
GLOVE ECLIPSE 9.0 STRL (GLOVE) ×2 IMPLANT
GLOVE EXAM NITRILE XL STR (GLOVE) IMPLANT
GOWN STRL REUS W/ TWL LRG LVL3 (GOWN DISPOSABLE) ×4 IMPLANT
GOWN STRL REUS W/ TWL XL LVL3 (GOWN DISPOSABLE) ×1 IMPLANT
GOWN STRL REUS W/TWL 2XL LVL3 (GOWN DISPOSABLE) ×1 IMPLANT
GOWN STRL REUS W/TWL LRG LVL3 (GOWN DISPOSABLE) ×2
GOWN STRL REUS W/TWL XL LVL3 (GOWN DISPOSABLE) ×2
INSERT FOGARTY 61MM (MISCELLANEOUS) IMPLANT
INSERT FOGARTY SM (MISCELLANEOUS) IMPLANT
KIT BASIN OR (CUSTOM PROCEDURE TRAY) ×2 IMPLANT
KIT INFUSE X SMALL 1.4CC (Orthopedic Implant) ×1 IMPLANT
KIT TURNOVER KIT B (KITS) ×2 IMPLANT
LOOP VESSEL MAXI BLUE (MISCELLANEOUS) IMPLANT
LOOP VESSEL MINI RED (MISCELLANEOUS) IMPLANT
NDL SPNL 18GX3.5 QUINCKE PK (NEEDLE) ×1 IMPLANT
NEEDLE HYPO 22GX1.5 SAFETY (NEEDLE) ×2 IMPLANT
NEEDLE SPNL 18GX3.5 QUINCKE PK (NEEDLE) ×2 IMPLANT
NS IRRIG 1000ML POUR BTL (IV SOLUTION) ×2 IMPLANT
OIL CARTRIDGE MAESTRO DRILL (MISCELLANEOUS) ×2
PACK LAMINECTOMY NEURO (CUSTOM PROCEDURE TRAY) ×2 IMPLANT
PAD ARMBOARD 7.5X6 YLW CONV (MISCELLANEOUS) ×5 IMPLANT
PUTTY BONE DBX 5CC MIX (Putty) ×1 IMPLANT
SPACER HEDRON IA 29X39 13 15D (Spacer) ×1 IMPLANT
SPONGE INTESTINAL PEANUT (DISPOSABLE) ×4 IMPLANT
SPONGE LAP 18X18 RF (DISPOSABLE) ×1 IMPLANT
SPONGE LAP 4X18 RFD (DISPOSABLE) IMPLANT
SPONGE SURGIFOAM ABS GEL 100 (HEMOSTASIS) ×3 IMPLANT
STAPLER VISISTAT (STAPLE) ×1 IMPLANT
STAPLER VISISTAT 35W (STAPLE) ×2 IMPLANT
STRIP CLOSURE SKIN 1/2X4 (GAUZE/BANDAGES/DRESSINGS) ×1 IMPLANT
SUT PROLENE 4 0 RB 1 (SUTURE)
SUT PROLENE 4-0 RB1 .5 CRCL 36 (SUTURE) IMPLANT
SUT PROLENE 5 0 CC1 (SUTURE) IMPLANT
SUT PROLENE 6 0 C 1 30 (SUTURE) ×1 IMPLANT
SUT PROLENE 6 0 CC (SUTURE) IMPLANT
SUT SILK 0 TIES 10X30 (SUTURE) ×1 IMPLANT
SUT SILK 2 0 TIES 10X30 (SUTURE) ×2 IMPLANT
SUT SILK 2 0SH CR/8 30 (SUTURE) IMPLANT
SUT SILK 3 0 SH CR/8 (SUTURE) IMPLANT
SUT SILK 3 0 TIES 10X30 (SUTURE) ×2 IMPLANT
SUT SILK 3 0SH CR/8 30 (SUTURE) IMPLANT
SUT VIC AB 0 CT1 18XCR BRD8 (SUTURE) ×1 IMPLANT
SUT VIC AB 0 CT1 27 (SUTURE)
SUT VIC AB 0 CT1 27XBRD ANBCTR (SUTURE) ×3 IMPLANT
SUT VIC AB 0 CT1 27XBRD ANTBC (SUTURE) IMPLANT
SUT VIC AB 0 CT1 8-18 (SUTURE) ×2
SUT VIC AB 2-0 CT1 18 (SUTURE) ×2 IMPLANT
SUT VIC AB 2-0 CTB1 (SUTURE) ×1 IMPLANT
SUT VIC AB 3-0 SH 27 (SUTURE) ×2
SUT VIC AB 3-0 SH 27X BRD (SUTURE) ×1 IMPLANT
SUT VIC AB 3-0 SH 8-18 (SUTURE) ×3 IMPLANT
SUT VICRYL 4-0 PS2 18IN ABS (SUTURE) ×1 IMPLANT
TOWEL GREEN STERILE (TOWEL DISPOSABLE) ×3 IMPLANT
TOWEL GREEN STERILE FF (TOWEL DISPOSABLE) ×2 IMPLANT
TRAY FOLEY MTR SLVR 16FR STAT (SET/KITS/TRAYS/PACK) ×2 IMPLANT
WATER STERILE IRR 1000ML POUR (IV SOLUTION) ×2 IMPLANT

## 2018-11-21 NOTE — Anesthesia Postprocedure Evaluation (Signed)
Anesthesia Post Note  Patient: Andrea Harper  Procedure(s) Performed: ANTERIOR LUMBAR INTERBODY FUSION-LUMBAR FIVE-SACRAL ONE (N/A Abdomen) ABDOMINAL EXPOSURE (N/A Abdomen)     Patient location during evaluation: PACU Anesthesia Type: General Level of consciousness: awake and alert Pain management: pain level controlled Vital Signs Assessment: post-procedure vital signs reviewed and stable Respiratory status: spontaneous breathing, nonlabored ventilation, respiratory function stable and patient connected to nasal cannula oxygen Cardiovascular status: blood pressure returned to baseline and stable Postop Assessment: no apparent nausea or vomiting Anesthetic complications: no    Last Vitals:  Vitals:   11/21/18 1059 11/21/18 1114  BP:  102/85  Pulse: 76 96  Resp: 15 16  Temp: (!) 36.2 C   SpO2: 100% 100%    Last Pain:  Vitals:   11/21/18 1059  TempSrc:   PainSc: 4                  Reed Dady DAVID

## 2018-11-21 NOTE — Anesthesia Procedure Notes (Addendum)
Procedure Name: Intubation Date/Time: 11/21/2018 7:51 AM Performed by: Jaasia Viglione T, CRNA Pre-anesthesia Checklist: Patient identified, Emergency Drugs available, Suction available and Patient being monitored Patient Re-evaluated:Patient Re-evaluated prior to induction Oxygen Delivery Method: Circle system utilized Preoxygenation: Pre-oxygenation with 100% oxygen Induction Type: IV induction, Rapid sequence and Cricoid Pressure applied Laryngoscope Size: Miller and 2 Grade View: Grade II Tube type: Oral Tube size: 7.5 mm Number of attempts: 1 Airway Equipment and Method: Patient positioned with wedge pillow Placement Confirmation: ETT inserted through vocal cords under direct vision,  positive ETCO2 and breath sounds checked- equal and bilateral Secured at: 22 cm Dental Injury: Teeth and Oropharynx as per pre-operative assessment

## 2018-11-21 NOTE — Interval H&P Note (Signed)
History and Physical Interval Note:  11/21/2018 7:23 AM  Levonne Lapping  has presented today for surgery, with the diagnosis of Spondylolisthesis.  The various methods of treatment have been discussed with the patient and family. After consideration of risks, benefits and other options for treatment, the patient has consented to  Procedure(s): ALIF - L5-S1 (N/A) ABDOMINAL EXPOSURE (N/A) as a surgical intervention.  The patient's history has been reviewed, patient examined, no change in status, stable for surgery.  I have reviewed the patient's chart and labs.  Questions were answered to the patient's satisfaction.     [name]

## 2018-11-21 NOTE — Brief Op Note (Signed)
11/21/2018  9:45 AM  PATIENT:  Levonne Lapping  38 y.o. female  PRE-OPERATIVE DIAGNOSIS:  Spondylolisthesis  POST-OPERATIVE DIAGNOSIS:  Spondylolisthesis  PROCEDURE:  Procedure(s): ANTERIOR LUMBAR INTERBODY FUSION-LUMBAR FIVE-SACRAL ONE (N/A) ABDOMINAL EXPOSURE (N/A)  SURGEON:  Surgeon(s) and Role: Panel 1:    Earnie Larsson, MD - Primary Panel 2:    * Angelia Mould, MD - Primary  PHYSICIAN ASSISTANT: Reinaldo Meeker, NP     ANESTHESIA:   general  EBL:  100 mL   BLOOD ADMINISTERED:none  DRAINS: none   LOCAL MEDICATIONS USED:  MARCAINE     SPECIMEN:  No Specimen  DISPOSITION OF SPECIMEN:  N/A  COUNTS:  YES  TOURNIQUET:  * No tourniquets in log *  DICTATION: .Dragon Dictation  PLAN OF CARE: Admit to inpatient   PATIENT DISPOSITION:  PACU - hemodynamically stable.   Delay start of Pharmacological VTE agent (>24hrs) due to surgical blood loss or risk of bleeding: yes

## 2018-11-21 NOTE — Evaluation (Signed)
Physical Therapy Evaluation Patient Details Name: Andrea Harper MRN: [id number] DOB: 12/04/1980 Today's Date: 11/21/2018   History of Present Illness  Pt is a 38 y/o female who presents s/p L5-S1 PLIF on 11/20/2018. No significant PMH noted.  Clinical Impression  Pt admitted with above diagnosis. Pt currently with functional limitations due to the deficits listed below (see PT Problem List). At the time of PT eval pt was able to perform transfers and ambulation with gross min guard to min assist with no AD (HHA provided for balance). Anticipate pt will progress well. She was educated on precautions, brace application/wearing schedule, and activity progression. Would like to see patient once more prior to d/c due to mild balance deficits. Pt will benefit from skilled PT to increase their independence and safety with mobility to allow discharge to the venue listed below.       Follow Up Recommendations No PT follow up;Supervision - Intermittent    Equipment Recommendations  None recommended by PT    Recommendations for Other Services       Precautions / Restrictions Precautions Precautions: Back Precaution Booklet Issued: Yes (comment) Precaution Comments: Reviewed handout in detail. Pt was cued for maintenance of precautions during functional mobility.  Required Braces or Orthoses: Spinal Brace Spinal Brace: Lumbar corset;Applied in sitting position Restrictions Weight Bearing Restrictions: No      Mobility  Bed Mobility Overal bed mobility: Modified Independent             General bed mobility comments: VC's for proper log roll technique. Able to complete with HOB elevated and use of rails.   Transfers Overall transfer level: Needs assistance Equipment used: 1 person hand held assist Transfers: Sit to/from Stand Sit to Stand: Supervision         General transfer comment: Supervision for safety. Pt guarded and reaching for external support upon stand.    Ambulation/Gait Ambulation/Gait assistance: Min assist;Min guard Gait Distance (Feet): 400 Feet Assistive device: 1 person hand held assist Gait Pattern/deviations: Step-through pattern;Decreased stride length Gait velocity: Decreased Gait velocity interpretation: 1.31 - 2.62 ft/sec, indicative of limited community ambulator General Gait Details: Very slow and guarded. Pt with UE's in high guard position, and required cues to relax RUE down by her side (HHA provided on the L). Occasional min assist required for balance.   Stairs            Wheelchair Mobility    Modified Rankin (Stroke Patients Only)       Balance Overall balance assessment: Needs assistance Sitting-balance support: Feet supported;No upper extremity supported Sitting balance-Leahy Scale: Fair     Standing balance support: No upper extremity supported;During functional activity Standing balance-Leahy Scale: Poor Standing balance comment: Reliant on external support at this time.                              Pertinent Vitals/Pain Pain Assessment: Faces Faces Pain Scale: Hurts little more Pain Location: Incision site Pain Descriptors / Indicators: Operative site guarding;Discomfort Pain Intervention(s): Limited activity within patient's tolerance;Monitored during session;Repositioned    Home Living Family/patient expects to be discharged to:: Private residence Living Arrangements: Spouse/significant other;Children Available Help at Discharge: Family Type of Home: House Home Access: Stairs to enter   Secretary/administratorntrance Stairs-Number of Steps: 2 Home Layout: Two level;Able to live on main level with bedroom/bathroom Home Equipment: None      Prior Function Level of Independence: Independent  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     Hand Dominance        Extremity/Trunk Assessment   Upper  Extremity Assessment Upper Extremity Assessment: Overall WFL for tasks assessed    Lower Extremity Assessment Lower Extremity Assessment: Overall WFL for tasks assessed    Cervical / Trunk Assessment Cervical / Trunk Assessment: Other exceptions Cervical / Trunk Exceptions: s/p surgery  Communication   Communication: No difficulties  Cognition Arousal/Alertness: Awake/alert Behavior During Therapy: WFL for tasks assessed/performed Overall Cognitive Status: Within Functional Limits for tasks assessed                                        General Comments      Exercises     Assessment/Plan    PT Assessment Patient needs continued PT services  PT Problem List Decreased strength;Decreased activity tolerance;Decreased balance;Decreased mobility;Decreased knowledge of use of DME;Decreased safety awareness;Decreased knowledge of precautions;Pain       PT Treatment Interventions DME instruction;Gait training;Stair training;Functional mobility training;Therapeutic activities;Therapeutic exercise;Neuromuscular re-education;Patient/family education    PT Goals (Current goals can be found in the Care Plan section)  Acute Rehab PT Goals Patient Stated Goal: Back to exercising (Piyo) PT Goal Formulation: With patient Time For Goal Achievement: 11/28/18 Potential to Achieve Goals: Good    Frequency Min 5X/week   Barriers to discharge        Co-evaluation               AM-PAC PT "6 Clicks" Mobility  Outcome Measure Help needed turning from your back to your side while in a flat bed without using bedrails?: None Help needed moving from lying on your back to sitting on the side of a flat bed without using bedrails?: None Help needed moving to and from a bed to a chair (including a wheelchair)?: A Little Help needed standing up from a chair using your arms (e.g., wheelchair or bedside chair)?: A Little Help needed to walk in hospital room?: A Little Help  needed climbing 3-5 steps with a railing? : A Little 6 Click Score: 20    End of Session Equipment Utilized During Treatment: Back brace Activity Tolerance: Patient tolerated treatment well Patient left: in chair;with call bell/phone within reach Nurse Communication: Mobility status PT Visit Diagnosis: Unsteadiness on feet (R26.81);Pain Pain - part of body: (back)    Time: 0272-5366 PT Time Calculation (min) (ACUTE ONLY): 24 min   Charges:   PT Evaluation $PT Eval Moderate Complexity: 1 Mod PT Treatments $Gait Training: 8-22 mins        Rolinda Roan, PT, DPT Acute Rehabilitation Services Pager: [phone] Office: [phone]   Thelma Comp 11/21/2018, 2:45 PM

## 2018-11-21 NOTE — Progress Notes (Signed)
PHARMACIST - PHYSICIAN ORDER COMMUNICATION  CONCERNING: P&T Medication Policy on Herbal Medications  DESCRIPTION:  This patient's order for:  biotin  has been noted.  This product(s) is classified as an "herbal" or natural product. Due to a lack of definitive safety studies or FDA approval, nonstandard manufacturing practices, plus the potential risk of unknown drug-drug interactions while on inpatient medications, the Pharmacy and Therapeutics Committee does not permit the use of "herbal" or natural products of this type within [place].   ACTION TAKEN: The pharmacy department is unable to verify this order at this time and your patient has been informed of this safety policy. Please reevaluate patient's clinical condition at discharge and address if the herbal or natural product(s) should be resumed at that time.   Stedman [place] A. Charlize Hathaway, PharmD, BCPS Clinical Pharmacist [place] Please utilize Amion for appropriate phone number to reach the unit pharmacist ([facility])   

## 2018-11-21 NOTE — Transfer of Care (Signed)
Immediate Anesthesia Transfer of Care Note  Patient: Andrea Harper  Procedure(s) Performed: ANTERIOR LUMBAR INTERBODY FUSION-LUMBAR FIVE-SACRAL ONE (N/A Abdomen) ABDOMINAL EXPOSURE (N/A Abdomen)  Patient Location: PACU  Anesthesia Type:General  Level of Consciousness: drowsy  Airway & Oxygen Therapy: Patient Spontanous Breathing  Post-op Assessment: Report given to RN, Post -op Vital signs reviewed and stable and Patient moving all extremities  Post vital signs: Reviewed and stable  Last Vitals:  Vitals Value Taken Time  BP 119/68 11/21/18 1000  Temp    Pulse 106 11/21/18 1001  Resp 16 11/21/18 1001  SpO2 100 % 11/21/18 1001  Vitals shown include unvalidated device data.  Last Pain:  Vitals:   11/21/18 0609  TempSrc:   PainSc: 8       Patients Stated Pain Goal: 2 (44/03/47 4259)  Complications: No apparent anesthesia complications

## 2018-11-21 NOTE — Anesthesia Preprocedure Evaluation (Signed)
Anesthesia Evaluation  Patient identified by MRN, date of birth, ID band Patient awake    Reviewed: Allergy & Precautions, NPO status , Patient's Chart, lab work & pertinent test results  History of Anesthesia Complications (+) PONV  Airway Mallampati: I  TM Distance: >3 FB Neck ROM: Full    Dental   Pulmonary    Pulmonary exam normal        Cardiovascular Normal cardiovascular exam     Neuro/Psych    GI/Hepatic   Endo/Other    Renal/GU      Musculoskeletal   Abdominal   Peds  Hematology   Anesthesia Other Findings   Reproductive/Obstetrics                             Anesthesia Physical Anesthesia Plan  ASA: II  Anesthesia Plan: General   Post-op Pain Management:    Induction: Intravenous  PONV Risk Score and Plan: 3 and Ondansetron, Midazolam and Dexamethasone  Airway Management Planned: Oral ETT  Additional Equipment:   Intra-op Plan:   Post-operative Plan: Extubation in OR  Informed Consent: I have reviewed the patients History and Physical, chart, labs and discussed the procedure including the risks, benefits and alternatives for the proposed anesthesia with the patient or authorized representative who has indicated his/her understanding and acceptance.       Plan Discussed with: CRNA and Surgeon  Anesthesia Plan Comments:         Anesthesia Quick Evaluation

## 2018-11-21 NOTE — Op Note (Signed)
    NAME: Andrea Harper    MRN: [id number] DOB: Feb 09, 1981    DATE OF OPERATION: 11/21/2018  PREOP DIAGNOSIS:    Degenerative disc disease L5-S1  POSTOP DIAGNOSIS:    Same  PROCEDURE:    Anterior retroperitoneal exposure L5-S1  EXPOSURE SURGEON: Judeth Cornfield. Scot Dock, MD, FACS  SPINE SURGEON: Earnie Larsson, MD  ANESTHESIA: General  EBL: Minimal  INDICATIONS:    Andrea Harper is a 38 y.o. female who presents for anterior lumbar interbody fusion at L5-S1 we were asked to provide anterior retroperitoneal exposure  FINDINGS:   The iliac artery was soft with no significant plaque.  TECHNIQUE:   The patient was taken to the operating room and received a general anesthetic.  The L5-S1 disc space was marked under fluoroscopy.  The abdomen was prepped and draped in usual sterile fashion.  A transverse incision to the left of the midline at the proposed level was made in the dissection carried down to the anterior rectus sheath.  The anterior rectus sheath was divided medially to expose the linea alba and some of the right rectus abdominis muscle.  The dissection was continued laterally out to the edge of the rectus abdominis muscle.  The rectus abdominis muscle was mobilized circumferentially.  It was then retracted medially and the retroperitoneal space was entered.  The dissection was carried down bluntly to the psoas and then onto the iliac artery.  The dissection was continued up to the crotch of the bifurcation protecting the left common iliac vein.  Middle sacral vessels were divided between clips and cauterized.  The Thompson retractor was then placed.  A reversed lip retractor was placed on the right side of the L5-S1 disc.Marland Kitchen A second reverse lip retractor was placed on the left side of the L5-S1 disc.  Fully mobilized the superior aspect and there was plenty of room here.  The third retractor was replaced inferiorly.  Position was confirmed under fluoroscopy.  The remainder of the  dictation is as per Dr. Annette Stable.  [name], MD, FACS [facility]  DATE OF DICTATION:   11/21/2018

## 2018-11-21 NOTE — H&P (Signed)
Andrea MuseJennifer Harper is an 38 y.o. female.   Chief Complaint: Back pain HPI: 38 year old female status post remote history of L5-S1 laminotomy microdiscectomy.  Patient with progressive, intractable back pain with occasional radicular symptoms.  Patient is failed all efforts of conservative management.  Work-up demonstrates evidence of progressive disc space collapse at L5-S1 with significant retrolisthesis and early foraminal stenosis.  Patient presents today for anterior lumbar interbody fusion in hopes of improving her symptoms.  Past Medical History:  Diagnosis Date  . 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  Marked disc degeneration with disc space collapse.    . Disc degeneration, lumbar    Broad-based disc bulge at L5-S1 level.  Significant Foraminal stenosis.  . Facet hypertrophy    At L4-5 with mild canal and Bilat L5 foraminal stenosis.   . Low back pain   . Numbness    and pain Into Left buttocks, posterior thigh, and posterior lateral leg.    Andrea Harper. PONV (postoperative nausea and vomiting)   . Retrolisthesis    4 mm of L5 S1, Grade 1.  More prominent with extension.    Past Surgical History:  Procedure Laterality Date  . BACK SURGERY    . CYST REMOVAL HAND Right    wrist x2  . WISDOM TOOTH EXTRACTION      History reviewed. No pertinent family history. Social History:  reports that she has never smoked. She has never used smokeless tobacco. She reports previous alcohol use. She reports previous drug use.  Allergies:  Allergies  Allergen Reactions  . Erythromycin Base     Childhood allergy, unknown reaction    Medications Prior to Admission  Medication Sig Dispense Refill  . Biotin 5 MG TABS Take 5 mg by mouth daily.    . cyclobenzaprine (FLEXERIL) 10 MG tablet Take 10 mg by mouth 3 (three) times daily as needed.     . Multiple Vitamin (MULTIVITAMIN WITH MINERALS) TABS tablet Take 1 tablet by mouth daily.      Results for orders placed or performed during the  hospital encounter of 11/21/18 (from the past 48 hour(s))  Pregnancy, urine POC     Status: None   Collection Time: 11/21/18  6:04 AM  Result Value Ref Range   Preg Test, Ur NEGATIVE NEGATIVE    Comment:        THE SENSITIVITY OF THIS METHODOLOGY IS >24 mIU/mL    No results found.  Pertinent items noted in HPI and remainder of comprehensive ROS otherwise negative.  Blood pressure (!) 123/93, pulse (!) 125, temperature 98.1 F (36.7 C), temperature source Oral, resp. rate 18, height 5\' 4"  (1.626 m), weight 56.7 kg.  Patient is awake and alert.  She is oriented and appropriate.  Speech is fluent.  Judgment insight are intact.  Cranial nerve function normal bilateral.  Motor examination extremities reveals 5/5 strength bilateral.  Sensory examination with some mild decrease sensation to pinprick and light touch in her left L5 and S1 dermatomes.  Deep tender versus normal active except her Achilles reflexes absent on the left..  No evidence of long track signs.  Gait is antalgic.  Examination head ears eyes nose throat is unremarkable her chest and abdomen are benign.  Extremities are free from injury or deformity. Assessment/Plan L5S1 severe degenerative disc disease with significant retrolisthesis and foraminal stenosis.  Plan L5-S1 anterior lumbar interbody fusion utilizing interbody cage, morselized allograft and bone morphogenic protein augmented with anterior plate instrumentation.  Risks and benefits of been explained.  Patient wishes to proceed.  Andrea Harper A Andrea Harper 11/21/2018, 7:25 AM

## 2018-11-21 NOTE — Op Note (Signed)
Date of procedure: 11/21/2018  Date of dictation: Same  Service: Neurosurgery  Preoperative diagnosis: L5-S1 degenerative spondylolisthesis with foraminal stenosis and intractable back pain  Postoperative diagnosis: Same  Procedure Name: L5-S1 anterior lumbar interbody fusion utilizing interbody cage, morselized allograft, bone morphogenic protein, and anterior plate fixation  Surgeon:Chancey Ringel A.Marlyn Rabine, M.D.  Asst. Surgeon: Reinaldo Meeker, NP  Vascular surgeon: Scot Dock  Anesthesia: General  Indication: 38 year old female status post remote L5-S1 laminotomy and microdiscectomy presents with chronic debilitating lumbar pain with intermittent radicular symptoms.  Patient is failed all conservative management.  Work-up demonstrates evidence of progressive disc space collapse with significant degenerative retrolisthesis and significant foraminal stenosis at L5-S1.  Remainder of her lumbar spine appears quite healthy.  Patient presents now for anterior lumbar interbody fusion in hopes of improving her symptoms.  Operative note: After induction of anesthesia, patient position supine with her arms outstretched.  Patient's abdominal region was prepped and draped sterilely.  Localizing fluoroscopy was used.  Incision made overlying the L5-S1 interspace on the left side of her lower abdomen.  Dr. Scot Dock then performed a retroperitoneal approach.  Retractors were placed.  Fluoroscopy was used.  Level was confirmed.  Midline was identified.  Disc base was then incised.  Discectomy was then performed using various instruments cleaning endplate and disc material down to the posterior annulus.  Using high-speed drill and curettes remaining aspects posterior annulus was removed.  Osteophytes were removed.  The vertebral bodies of L5 and S1 were undercut.  Decompression proceeded out laterally and a full release of the interspace had been achieved.  The disc base was distracted.  A 13 mm x 15 degree large globus he Dron  implant was found to be the best fit.  This was packed with morselized DBX putty and bone morphogenic protein soaked sponges.  This is intact in the place under fluoroscopic guidance.  Integral anterior plate was then affixed to L5 and S1 by hammering and locking stays into the body of L5 and S1.  Final images reveal good position of the cage and the at the proper upper level with market improvement of her alignment.  Wound is irrigated one final time.  Is then closed in layers with Vicryl sutures.  Steri-Strips and sterile dressing were applied.  No apparent complications.  Patient tolerated the procedure well and she returns to the recovery room postop.

## 2018-11-22 ENCOUNTER — Encounter (HOSPITAL_COMMUNITY): Payer: Self-pay | Admitting: Neurosurgery

## 2018-11-22 MED ORDER — OXYCODONE HCL 10 MG PO TABS: 5.0000 mg | ORAL_TABLET | ORAL | 0 refills | Status: AC | PRN

## 2018-11-22 MED ORDER — CYCLOBENZAPRINE HCL 10 MG PO TABS: 10.0000 mg | ORAL_TABLET | Freq: Three times a day (TID) | ORAL | 0 refills | Status: AC | PRN

## 2018-11-22 NOTE — Progress Notes (Signed)
   VASCULAR SURGERY ASSESSMENT & PLAN:   1 Day Post-Op s/p: Anterior retroperitoneal exposure L5-S1.  Doing well.  She has palpable pedal pulses.  Vascular will be available as needed.  SUBJECTIVE:   Pain well controlled.  PHYSICAL EXAM:   Vitals:   11/21/18 1642 11/21/18 2025 11/21/18 2307 11/22/18 0309  BP: 113/65 101/62 98/67 111/74  Pulse: 93 87 92 87  Resp: 20 16 18 18   Temp: 98.1 F (36.7 C) 97.6 F (36.4 C) 97.7 F (36.5 C) 97.8 F (36.6 C)  TempSrc: Oral Oral Oral Oral  SpO2: 100% 100% 100% 100%  Weight:      Height:       Palpable dorsalis pedis pulses bilaterally.  LABS:    PROBLEM LIST:    Active Problems:   Degenerative spondylolisthesis   CURRENT MEDS:   . multivitamin with minerals  1 tablet Oral Daily  . sodium chloride flush  3 mL Intravenous Q12H    Andrea Harper Beeper: [phone] Office: [phone] 11/22/2018

## 2018-11-22 NOTE — Progress Notes (Signed)
Patient is discharged from room 3C08 at this time. Alert and in stable condition. IV site d/c'd and instructions read to patient with understanding verbalized. Left unit via wheelchair with all belongings at side. 

## 2018-11-22 NOTE — Evaluation (Signed)
Occupational Therapy Evaluation and Discharge Patient Details Name: Andrea Harper MRN: [id number] DOB: Oct 03, 1980 Today's Date: 11/22/2018    History of Present Illness Pt is a 38 y/o female who presents s/p L5-S1 PLIF on 11/20/2018. No significant PMH noted.   Clinical Impression   Pt educated in back precautions related to ADL and IADL with pt verbalizing understanding. No further OT needs.    Follow Up Recommendations  No OT follow up    Equipment Recommendations  None recommended by OT    Recommendations for Other Services       Precautions / Restrictions Precautions Precautions: Back Precaution Booklet Issued: Yes (comment) Precaution Comments: reinforced back precautions Required Braces or Orthoses: Spinal Brace Spinal Brace: Lumbar corset;Applied in sitting position Restrictions Weight Bearing Restrictions: No      Mobility Bed Mobility Overal bed mobility: Modified Independent             General bed mobility comments: used log roll without cues  Transfers Overall transfer level: Modified independent Equipment used: None             General transfer comment: slow to rise, no assist    Balance Overall balance assessment: No apparent balance deficits (not formally assessed)                                         ADL either performed or assessed with clinical judgement   ADL Overall ADL's : Needs assistance/impaired Eating/Feeding: Independent;Sitting   Grooming: Wash/dry face;Standing;Modified independent   Upper Body Bathing: Set up;Sitting Upper Body Bathing Details (indicate cue type and reason): recommended long handled bath sponge Lower Body Bathing: Minimal assistance;Sit to/from stand Lower Body Bathing Details (indicate cue type and reason): recommended long handled bath sponge Upper Body Dressing : Set up;Sitting   Lower Body Dressing: Minimal assistance;Sit to/from stand Lower Body Dressing Details (indicate  cue type and reason): assist for socks and shoes, donned pants modified independently, will rely on family Toilet Transfer: Modified Independent   Toileting- Clothing Manipulation and Hygiene: Modified independent Toileting - Clothing Manipulation Details (indicate cue type and reason): educated to avoid twisting with pericare     Functional mobility during ADLs: Modified independent(slow) General ADL Comments: educated in IADL to avoid     Vision Baseline Vision/History: No visual deficits       Perception     Praxis      Pertinent Vitals/Pain Pain Assessment: 0-10 Pain Score: 7  Pain Location: abdomen Pain Descriptors / Indicators: Operative site guarding;Discomfort Pain Intervention(s): Monitored during session;Premedicated before session;Repositioned     Hand Dominance Right   Extremity/Trunk Assessment Upper Extremity Assessment Upper Extremity Assessment: Overall WFL for tasks assessed   Lower Extremity Assessment Lower Extremity Assessment: Defer to PT evaluation       Communication Communication Communication: No difficulties   Cognition Arousal/Alertness: Awake/alert Behavior During Therapy: WFL for tasks assessed/performed Overall Cognitive Status: Within Functional Limits for tasks assessed                                     General Comments       Exercises     Shoulder Instructions      Home Living Family/patient expects to be discharged to:: Private residence Living Arrangements: Spouse/significant other;Children Available Help at Discharge: Family Type of Home: House  Home Access: Stairs to enter Entrance Stairs-Number of Steps: 2   Home Layout: Two level;Able to live on main level with bedroom/bathroom Alternate Level Stairs-Number of Steps: flight - children's rooms are upstairs   Bathroom Shower/Tub: Producer, television/film/videoWalk-in shower   Bathroom Toilet: Standard     Home Equipment: None          Prior Functioning/Environment  Level of Independence: Independent        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        OT Problem List:        OT Treatment/Interventions:      OT Goals(Current goals can be found in the care plan section) Acute Rehab OT Goals Patient Stated Goal: Back to exercising (Piyo)  OT Frequency:     Barriers to D/C:            Co-evaluation              AM-PAC OT "6 Clicks" Daily Activity     Outcome Measure Help from another person eating meals?: None Help from another person taking care of personal grooming?: None Help from another person toileting, which includes using toliet, bedpan, or urinal?: None Help from another person bathing (including washing, rinsing, drying)?: A Little Help from another person to put on and taking off regular upper body clothing?: None Help from another person to put on and taking off regular lower body clothing?: A Little 6 Click Score: 22   End of Session Equipment Utilized During Treatment: Back brace  Activity Tolerance: Patient tolerated treatment well Patient left: in chair;with call bell/phone within reach  OT Visit Diagnosis: Other abnormalities of gait and mobility (R26.89)                Time: 1610-9604: 0811-0844 OT Time Calculation (min): 33 min Charges:  OT General Charges $OT Visit: 1 Visit OT Evaluation $OT Eval Low Complexity: 1 Low OT Treatments $Self Care/Home Management : 8-22 mins  [name], OTR/L Acute Rehabilitation Services Pager: [phone] Office: [phone]  Evern BioMayberry, [name] Lynn 11/22/2018, 9:15 AM

## 2018-11-22 NOTE — Discharge Summary (Signed)
Physician Discharge Summary  Patient ID: Mckenzie Bove MRN: [id number] DOB/AGE: 38-Dec-1982 38 y.o.  Admit date: 11/21/2018 Discharge date: 11/22/2018  Admission Diagnoses:  Discharge Diagnoses:  Active Problems:   Degenerative spondylolisthesis   Discharged Condition: good  Hospital Course: Patient admitted to the hospital where she underwent uncomplicated J6-G8 anterior lumbar interbody fusion.  Postoperatively doing well.  Complains of back tightness and soreness.  No radicular pain numbness or weakness.  Standing and walking well.  Voiding well.  Ready for discharge home.  Consults:   Significant Diagnostic Studies:   Treatments:   Discharge Exam: Blood pressure 112/80, pulse 87, temperature 97.9 F (36.6 C), temperature source Oral, resp. rate 18, height 5\' 4"  (1.626 m), weight 56.7 kg, SpO2 94 %. Awake and alert.  Oriented and appropriate.  Cranial nerve function intact.  Motor and sensory function extremities normal.  Wound clean and dry.  Chest and abdomen benign.  Disposition: Discharge disposition: 01-Home or Self Care        Allergies as of 11/22/2018      Reactions   Erythromycin Base    Childhood allergy, unknown reaction      Medication List    TAKE these medications   Biotin 5 MG Tabs Take 5 mg by mouth daily.   cyclobenzaprine 10 MG tablet Commonly known as: FLEXERIL Take 10 mg by mouth 3 (three) times daily as needed. What changed: Another medication with the same name was added. Make sure you understand how and when to take each.   cyclobenzaprine 10 MG tablet Commonly known as: FLEXERIL Take 1 tablet (10 mg total) by mouth 3 (three) times daily as needed for muscle spasms. What changed: You were already taking a medication with the same name, and this prescription was added. Make sure you understand how and when to take each.   multivitamin with minerals Tabs tablet Take 1 tablet by mouth daily.   Oxycodone HCl 10 MG Tabs Take 0.5-1  tablets (5-10 mg total) by mouth every 3 (three) hours as needed for severe pain ((score 7 to 10)).            Durable Medical Equipment  (From admission, onward)         Start     Ordered   11/21/18 1116  DME Walker rolling  Once    Question:  Patient needs a walker to treat with the following condition  Answer:  Degenerative spondylolisthesis   11/21/18 1115   11/21/18 1116  DME 3 n 1  Once     11/21/18 1115           Signed: Cooper Render Tanasia Budzinski 11/22/2018, 9:36 AM

## 2018-11-22 NOTE — Discharge Instructions (Signed)

## 2018-11-22 NOTE — Progress Notes (Signed)
Physical Therapy Treatment and Discharge Patient Details Name: Andrea Harper MRN: [id number] DOB: 22-Mar-1981 Today's Date: 11/22/2018    History of Present Illness Pt is a 38 y/o female who presents s/p L5-S1 PLIF on 11/20/2018. No significant PMH noted.    PT Comments    Pt progressing well with post-op mobility. Pt is currently functioning at a mod I level without an AD. Pt was educated on brace application/wearing schedule, car transfer, precautions, and activity progression. Will sign off at this time as pt has met acute PT goals. If needs change, please reconsult.    Follow Up Recommendations  No PT follow up;Supervision - Intermittent     Equipment Recommendations  None recommended by PT    Recommendations for Other Services       Precautions / Restrictions Precautions Precautions: Back Precaution Booklet Issued: Yes (comment) Precaution Comments: reinforced back precautions Required Braces or Orthoses: Spinal Brace Spinal Brace: Lumbar corset;Applied in sitting position Restrictions Weight Bearing Restrictions: No    Mobility  Bed Mobility Overal bed mobility: Modified Independent             General bed mobility comments: used log roll without cues  Transfers Overall transfer level: Modified independent Equipment used: None Transfers: Sit to/from Stand           General transfer comment: slow to rise, no assist  Ambulation/Gait Ambulation/Gait assistance: Modified independent (Device/Increase time) Gait Distance (Feet): 400 Feet Assistive device: None Gait Pattern/deviations: Step-through pattern;Decreased stride length Gait velocity: Decreased Gait velocity interpretation: 1.31 - 2.62 ft/sec, indicative of limited community ambulator General Gait Details: Slow and guarded but with more casual gait than on evaluation. No unsteadiness or LOB noted.    Stairs Stairs: Yes Stairs assistance: Modified independent (Device/Increase time) Stair  Management: One rail Right;Step to pattern;Forwards Number of Stairs: 10 General stair comments: VC's for sequencing/safety. No assist required.    Wheelchair Mobility    Modified Rankin (Stroke Patients Only)       Balance Overall balance assessment: No apparent balance deficits (not formally assessed) Sitting-balance support: Feet supported;No upper extremity supported Sitting balance-Leahy Scale: Fair     Standing balance support: No upper extremity supported;During functional activity Standing balance-Leahy Scale: Fair                              Cognition Arousal/Alertness: Awake/alert Behavior During Therapy: WFL for tasks assessed/performed Overall Cognitive Status: Within Functional Limits for tasks assessed                                        Exercises      General Comments        Pertinent Vitals/Pain Pain Assessment: Faces Pain Score: 7  Faces Pain Scale: Hurts little more Pain Location: abdomen Pain Descriptors / Indicators: Operative site guarding;Discomfort Pain Intervention(s): Monitored during session    Home Living Family/patient expects to be discharged to:: Private residence Living Arrangements: Spouse/significant other;Children Available Help at Discharge: Family Type of Home: House Home Access: Stairs to enter   Home Layout: Two level;Able to live on main level with bedroom/bathroom Home Equipment: None      Prior Function Level of Independence: Independent      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  PT Goals (current goals can now be found in the care plan section) Acute Rehab PT Goals Patient Stated Goal: Back to exercising (Piyo) PT Goal Formulation: With patient Time For Goal Achievement: 11/28/18 Potential to Achieve Goals: Good Progress towards PT goals: Goals met/education completed, patient discharged from  PT    Frequency    Min 5X/week      PT Plan Current plan remains appropriate    Co-evaluation              AM-PAC PT "6 Clicks" Mobility   Outcome Measure  Help needed turning from your back to your side while in a flat bed without using bedrails?: None Help needed moving from lying on your back to sitting on the side of a flat bed without using bedrails?: None Help needed moving to and from a bed to a chair (including a wheelchair)?: A Little Help needed standing up from a chair using your arms (e.g., wheelchair or bedside chair)?: A Little Help needed to walk in hospital room?: A Little Help needed climbing 3-5 steps with a railing? : A Little 6 Click Score: 20    End of Session Equipment Utilized During Treatment: Back brace Activity Tolerance: Patient tolerated treatment well Patient left: in chair;with call bell/phone within reach Nurse Communication: Mobility status PT Visit Diagnosis: Unsteadiness on feet (R26.81);Pain Pain - part of body: (back)     Time: 3474-2595 PT Time Calculation (min) (ACUTE ONLY): 15 min  Charges:  $Gait Training: 8-22 mins                     Rolinda Roan, PT, DPT Acute Rehabilitation Services Pager: [phone] Office: [phone]    Thelma Comp 11/22/2018, 9:59 AM

## 2018-11-24 MED FILL — Sodium Chloride IV Soln 0.9%: INTRAVENOUS | Qty: 1000 | Status: AC

## 2018-11-24 MED FILL — Heparin Sodium (Porcine) Inj 1000 Unit/ML: INTRAMUSCULAR | Qty: 30 | Status: AC

## 2019-07-29 ENCOUNTER — Ambulatory Visit: Payer: BC Managed Care – PPO

## 2019-09-14 ENCOUNTER — Other Ambulatory Visit: Payer: Self-pay | Admitting: Neurosurgery

## 2019-09-14 DIAGNOSIS — M4310 Spondylolisthesis, site unspecified: Secondary | ICD-10-CM

## 2019-09-28 ENCOUNTER — Ambulatory Visit
Admission: RE | Admit: 2019-09-28 | Discharge: 2019-09-28 | Disposition: A | Discharge status: Home / Self Care | Payer: BC Managed Care – PPO | Source: Ambulatory Visit | Attending: Neurosurgery | Admitting: Neurosurgery

## 2019-09-28 DIAGNOSIS — M4310 Spondylolisthesis, site unspecified: Secondary | ICD-10-CM

## 2020-01-22 ENCOUNTER — Other Ambulatory Visit: Payer: Self-pay

## 2020-01-22 DIAGNOSIS — I83819 Varicose veins of unspecified lower extremities with pain: Secondary | ICD-10-CM

## 2020-01-31 ENCOUNTER — Ambulatory Visit: Payer: BC Managed Care – PPO | Admitting: Vascular Surgery

## 2020-01-31 ENCOUNTER — Other Ambulatory Visit: Payer: Self-pay

## 2020-01-31 ENCOUNTER — Encounter: Payer: Self-pay | Admitting: Vascular Surgery

## 2020-01-31 ENCOUNTER — Ambulatory Visit (HOSPITAL_COMMUNITY)
Admission: RE | Admit: 2020-01-31 | Discharge: 2020-01-31 | Disposition: A | Discharge status: Home / Self Care | Payer: BC Managed Care – PPO | Source: Ambulatory Visit | Attending: Vascular Surgery | Admitting: Vascular Surgery

## 2020-01-31 VITALS — BP 126/81 | HR 79 | Temp 98.1°F | Resp 14 | Ht 64.0 in | Wt 120.0 lb

## 2020-01-31 DIAGNOSIS — I83811 Varicose veins of right lower extremities with pain: Secondary | ICD-10-CM

## 2020-01-31 DIAGNOSIS — I83819 Varicose veins of unspecified lower extremities with pain: Secondary | ICD-10-CM

## 2020-01-31 NOTE — Progress Notes (Signed)
REASON FOR VISIT:   Painful varicose veins of both lower extremities.  The consult is requested by Dr. Tonny Bollman.  MEDICAL ISSUES:   CHRONIC VENOUS DISEASE: Based on the patient's venous duplex scan she has evidence of some mild superficial and deep venous reflux consistent with chronic venous disease. (CEAP C1).  We have discussed conservative measures.  I discussed with her the importance of elevating her legs daily and the proper positioning for this.  In addition, we discussed the importance of compression stockings especially when she plans on being on her feet for a long time.  I have encouraged her to avoid prolonged sitting and standing.  We discussed importance of exercise specifically walking and water aerobics.  In addition we have discussed the importance of maintaining a healthy weight as central obesity especially increases lower extremity venous pressure.  Currently she is not a candidate for laser ablation of the great saphenous vein on either side.  I explained that if her symptoms progress in the future or she develops more significant varicosities that certainly we could reevaluate her.  She will call if this happens.   HPI:   Andrea Harper is a pleasant 39 y.o. female who developed some small varicose veins in her right leg that were symptomatic and is referred for vascular consultation.  The patient describes some mild symptoms in her anterior right leg adjacent to her varicosities.  She states that it feels like shin splints.  She has some mild achiness in her legs which is aggravated by standing and relieved with elevation.  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  She is had no previous venous procedures.  She has had no previous history of DVT.  She does not wear compression stockings.  She does not require ibuprofen for pain.  She is otherwise quite healthy.  I have previously seen her for anterior lumbar interbody fusion and provided retroperitoneal  exposure for Dr. Jordan Likes.  Past Medical History:  Diagnosis Date  . 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  Marked disc degeneration with disc space collapse.    . Disc degeneration, lumbar    Broad-based disc bulge at L5-S1 level.  Significant Foraminal stenosis.  . Facet hypertrophy    At L4-5 with mild canal and Bilat L5 foraminal stenosis.   . Low back pain   . Numbness    and pain Into Left buttocks, posterior thigh, and posterior lateral leg.    Marland Kitchen PONV (postoperative nausea and vomiting)   . Retrolisthesis    4 mm of L5 S1, Grade 1.  More prominent with extension.    Family History  Problem Relation Age of Onset  . Cancer Mother   . Asthma Father   . Vasculitis Father   . Hypertension Father     SOCIAL HISTORY: Social History   Tobacco Use  . Smoking status: Never Smoker  . Smokeless tobacco: Never Used  Substance Use Topics  . Alcohol use: Not Currently    Allergies  Allergen Reactions  . Erythromycin Base Hives    Childhood allergy, unknown reaction Childhood allergy, unknown reaction     Current Outpatient Medications  Medication Sig Dispense Refill  . Biotin 5 MG TABS Take 5 mg by mouth daily.    . Multiple Vitamin (MULTIVITAMIN WITH MINERALS) TABS tablet Take 1 tablet by mouth daily.    . pregabalin (LYRICA) 50 MG capsule Take 50 mg by mouth 2 (two) times daily.    Marland Kitchen  cyclobenzaprine (FLEXERIL) 10 MG tablet Take 10 mg by mouth 3 (three) times daily as needed.  (Patient not taking: Reported on 01/31/2020)    . cyclobenzaprine (FLEXERIL) 10 MG tablet Take 1 tablet (10 mg total) by mouth 3 (three) times daily as needed for muscle spasms. (Patient not taking: Reported on 01/31/2020) 30 tablet 0  . oxyCODONE 10 MG TABS Take 0.5-1 tablets (5-10 mg total) by mouth every 3 (three) hours as needed for severe pain ((score 7 to 10)). (Patient not taking: Reported on 01/31/2020) 40 tablet 0   No current facility-administered medications for this visit.    REVIEW OF  SYSTEMS:  [X]  denotes positive finding, [ ]  denotes negative finding Cardiac  Comments:  Chest pain or chest pressure:    Shortness of breath upon exertion:    Short of breath when lying flat:    Irregular heart rhythm:        Vascular    Pain in calf, thigh, or hip brought on by ambulation:    Pain in feet at night that wakes you up from your sleep:     Blood clot in your veins:    Leg swelling:         Pulmonary    Oxygen at home:    Productive cough:     Wheezing:         Neurologic    Sudden weakness in arms or legs:     Sudden numbness in arms or legs:     Sudden onset of difficulty speaking or slurred speech:    Temporary loss of vision in one eye:     Problems with dizziness:         Gastrointestinal    Blood in stool:     Vomited blood:         Genitourinary    Burning when urinating:     Blood in urine:        Psychiatric    Major depression:         Hematologic    Bleeding problems:    Problems with blood clotting too easily:        Skin    Rashes or ulcers:        Constitutional    Fever or chills:     PHYSICAL EXAM:   Vitals:   01/31/20 1510  BP: 126/81  Pulse: 79  Resp: 14  Temp: 98.1 F (36.7 C)  TempSrc: Temporal  SpO2: 100%  Weight: 120 lb (54.4 kg)  Height: 5\' 4"  (1.626 m)    GENERAL: The patient is a well-nourished female, in no acute distress. The vital signs are documented above. CARDIAC: There is a regular rate and rhythm.  VASCULAR: I do not detect carotid bruits. She has palpable pedal pulses bilaterally. She has some small reticular veins in her medial right calf. She has no significant lower extremity swelling.  She has no hyperpigmentation.    PULMONARY: There is good air exchange bilaterally without wheezing or rales. ABDOMEN: Soft and non-tender with normal pitched bowel sounds.  MUSCULOSKELETAL: There are no major deformities or cyanosis. NEUROLOGIC: No focal weakness or paresthesias are detected. SKIN: There are  no ulcers or rashes noted. PSYCHIATRIC: The patient has a normal affect.  DATA:    VENOUS DUPLEX: I have independently interpreted her venous duplex scan today.  On the right side there is no evidence of DVT or superficial venous thrombosis.  There is deep venous reflux involving the common femoral vein.  There is superficial venous reflux in the mid and distal thigh although the vein is not dilated here.  On the left side there is no evidence of DVT or superficial venous thrombosis.  There is no deep venous reflux.  There is superficial venous reflux in the great saphenous vein in the proximal thigh.  The vein however is not especially dilated here.  Waverly Ferrari [facility] [phone]

## 2020-02-21 IMAGING — CR OR LOCAL ABDOMEN
1 series · 1 of 1 positions shown · non-contrast
Comparison: Radiographs of August 10, 2018.

CLINICAL DATA: Final instrumentation count.

EXAM:
OR LOCAL ABDOMEN

[AP]
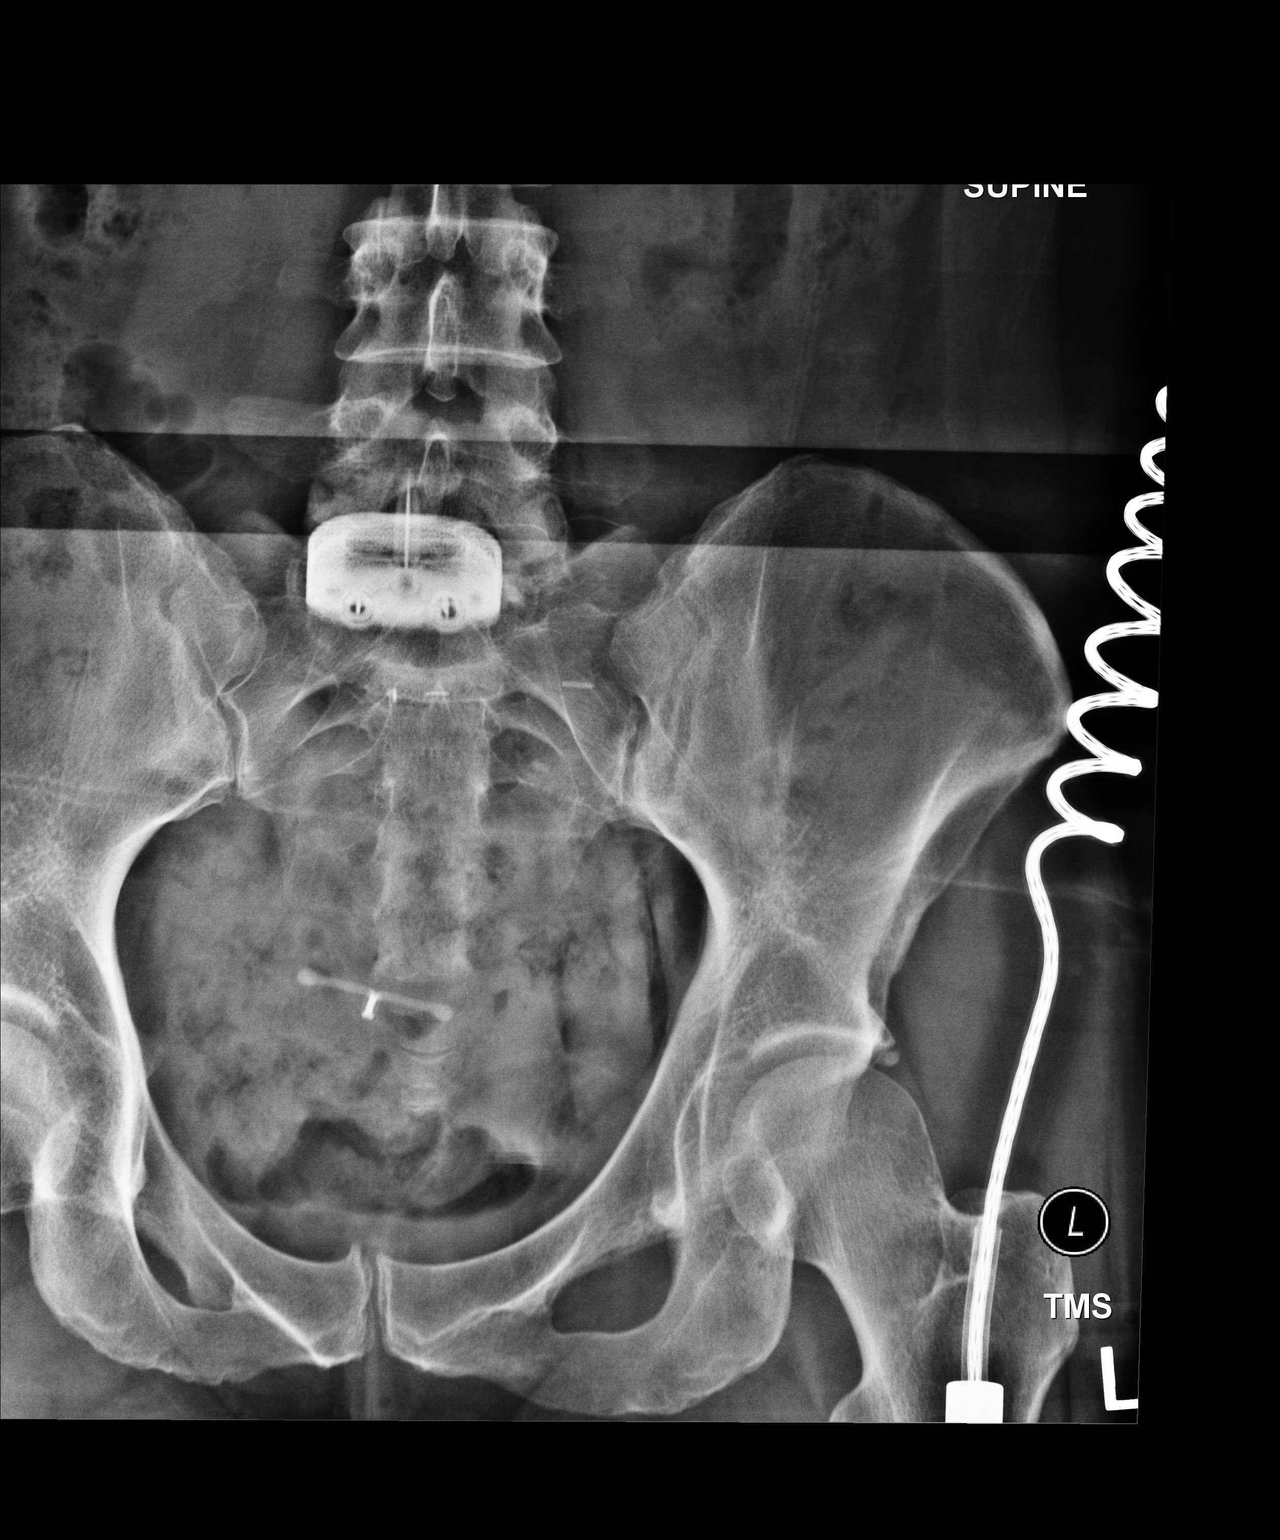

[1 of 1 positions shown; findings below may reference images not displayed]

FINDINGS: The bowel gas pattern is normal. Status post surgical anterior
fusion of L5-S1. Intrauterine device is noted. Surgical clips are
seen over the sacrum. No other radiopaque foreign body is noted.
IMPRESSION: Status post surgical anterior fusion of L5-S1. Intrauterine device
is noted. Surgical clips are seen over the sacrum. No other
radiopaque foreign body is seen. These results were called by
telephone at the time of interpretation on 11/21/2018 at [DATE] to
Rraman Boiocchi, who verbally acknowledged these results.

## 2020-12-28 IMAGING — CT CT L SPINE W/O CM
3 of 5 series · 11 of 33 positions shown, 13 images · non-contrast
Comparison: Lumbar MRI 06/19/2019

CLINICAL DATA: Chronic low back pain, last surgery [DATE]

EXAM:
CT LUMBAR SPINE WITHOUT CONTRAST
TECHNIQUE: Multidetector CT imaging of the lumbar spine was performed without
intravenous contrast administration. Multiplanar CT image
reconstructions were also generated.

[Series 5: l-spine 2.00 br60 s3 sag bone · sagittal · 0.30mm/px · 5 of 77 slices shown, 6 images]
[im 26/77  bone]
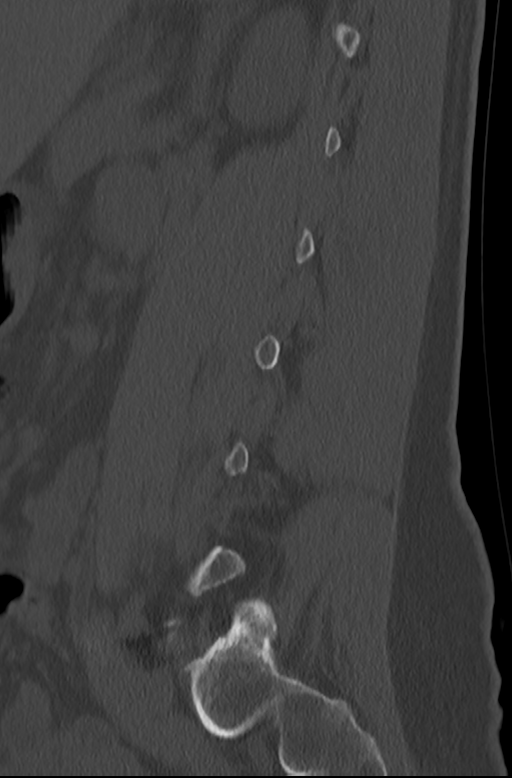
[im 32/77  bone]
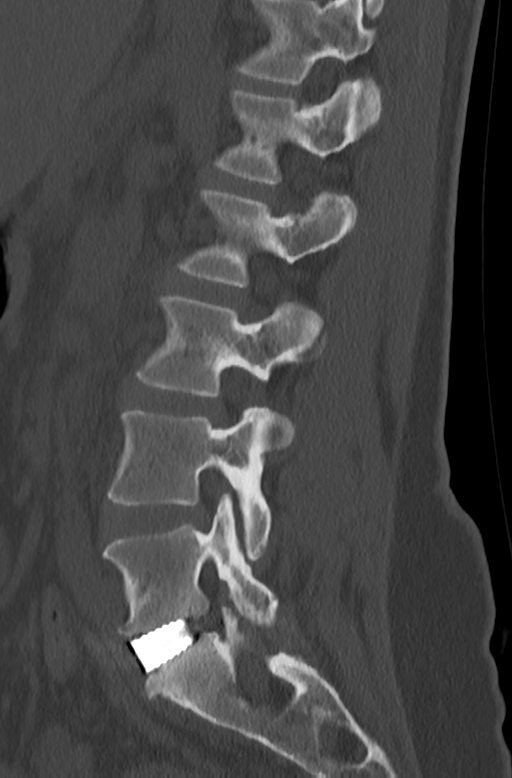
[im 39/77  soft-tissue]
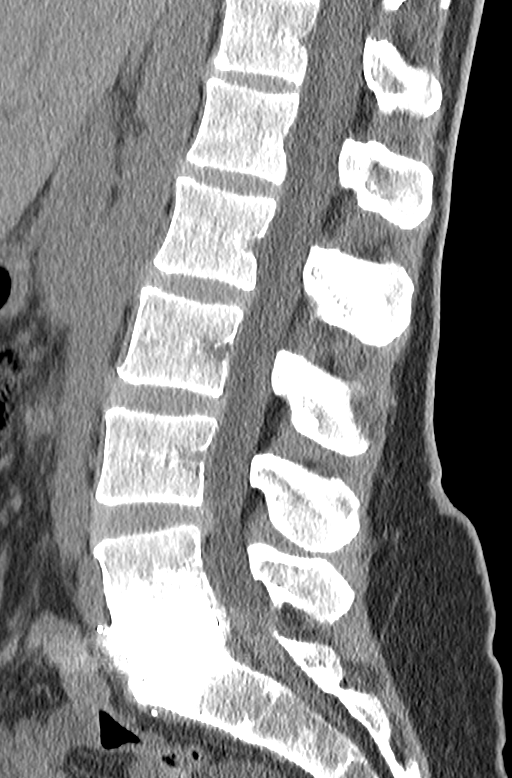
[im 39/77  bone]
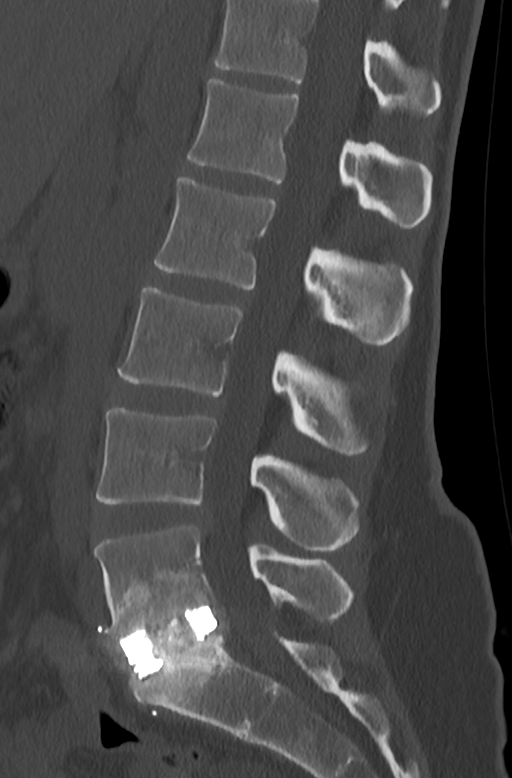
[im 45/77  bone]
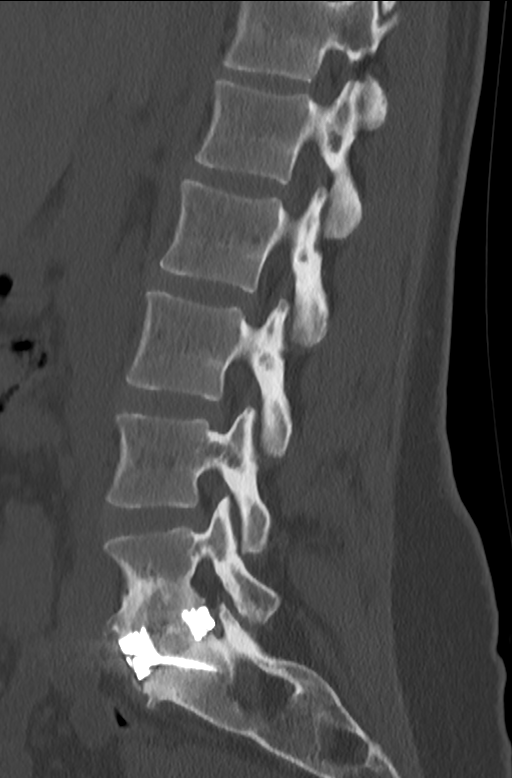
[im 51/77  bone]
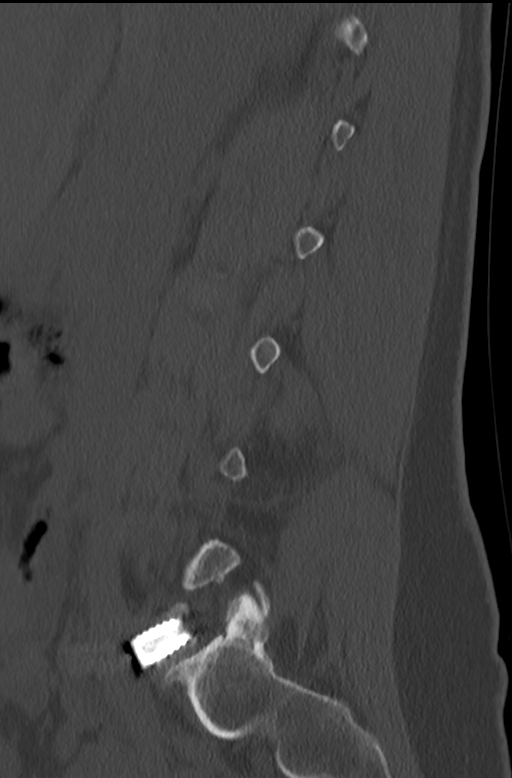

[Series 7: l-spine 2.00 br60 s3 cor bone · coronal · 0.30mm/px · 3 of 77 slices shown]
[im 16/77  bone]
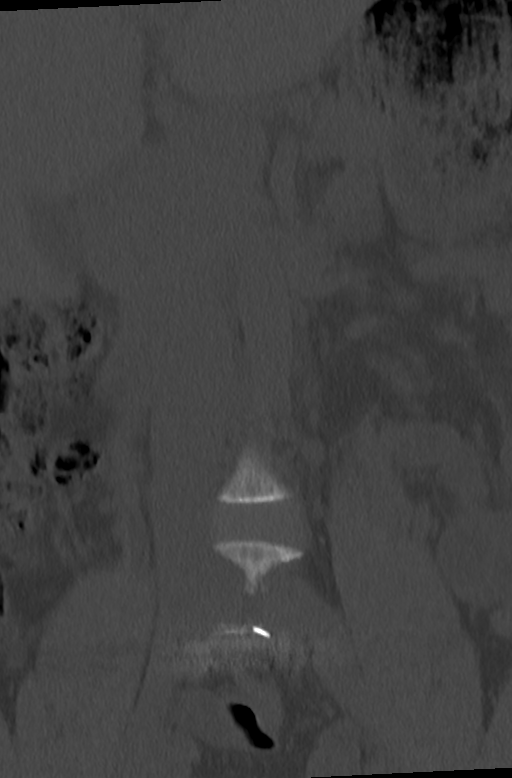
[im 31/77  bone]
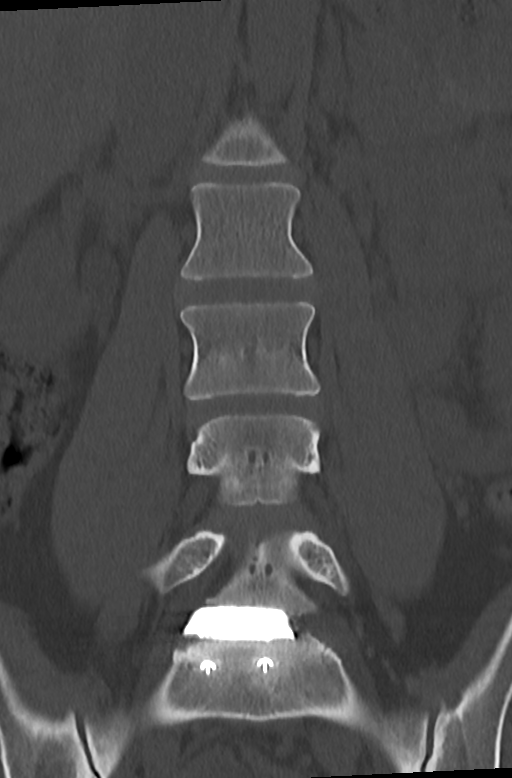
[im 46/77  bone]
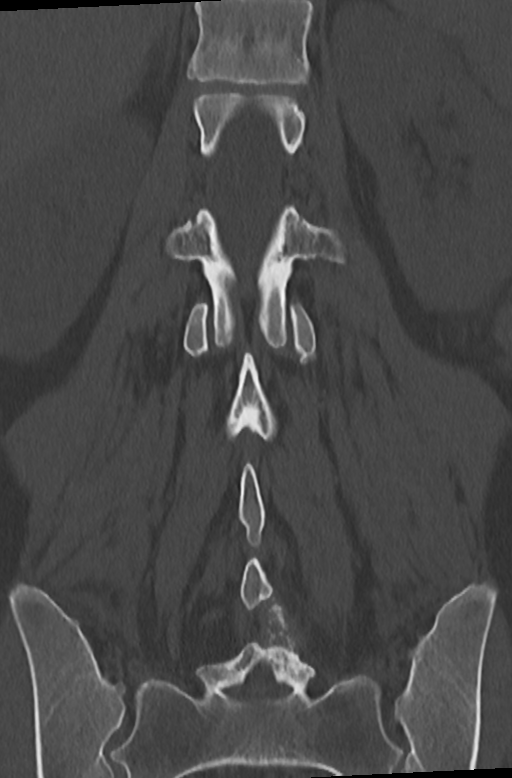

[Series 10: l-spine 2.00 br40 s3 true axials · axial · 0.30mm/px · z∈[+1322,+1462]mm · 3 of 119 slices shown, 4 images]
[im 30/119  soft-tissue]
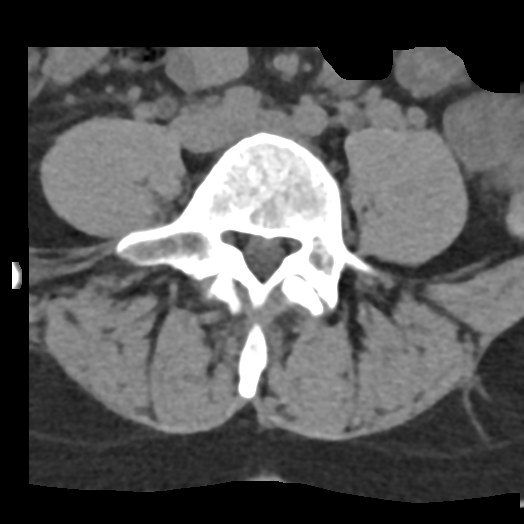
[im 30/119  bone]
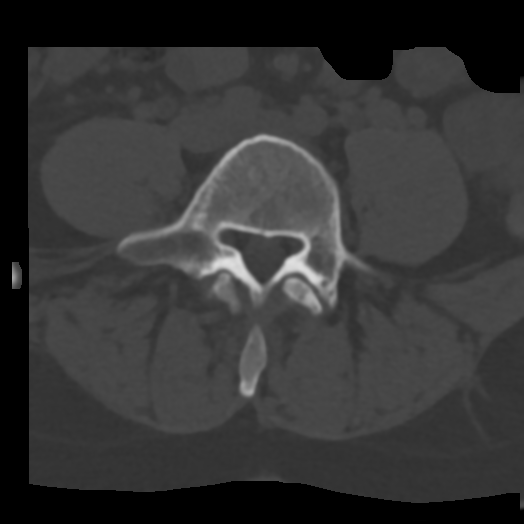
[im 60/119  bone]
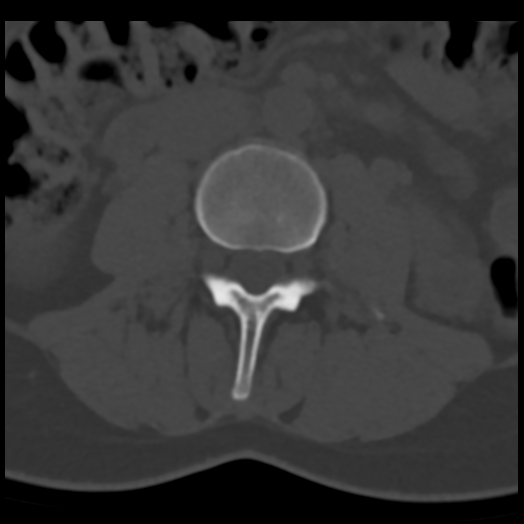
[im 89/119  bone]
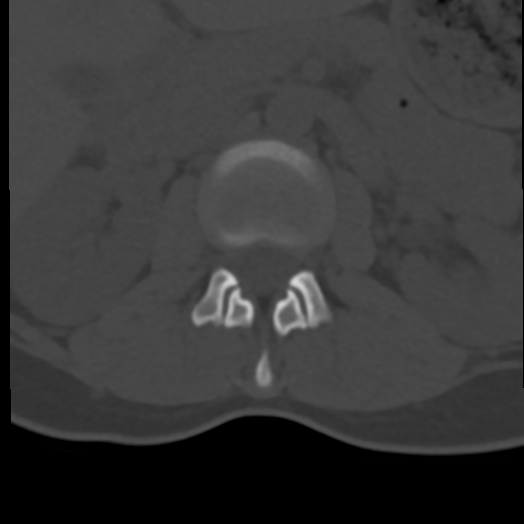

[11 of 33 positions shown; findings below may reference images not displayed]

FINDINGS: Segmentation: 5 non-rib-bearing lumbar type vertebral bodies are
noted. Last fully formed disc space denoted as L5-S1 the purposes of
this exam. Numbering system is consistent with prior MRI.

Alignment: L5-S1 anterior lumbar interbody fusion with cage
placement. Mild retrolisthesis of approximately 1 mm at this level
is similar to comparison MRI. Lumbar alignment is otherwise normally
preserved without spondylolysis or spondylolisthesis.

Vertebrae: Postsurgical changes from L5-S1 discectomy with anterior
lumbar interbody fusion and cage placement with sclerotic changes of
the adjacent endplates likely reactive to the operative procedure
without evidence of acute subsidence or other postsurgical
complication. Additional mineralization adjacent the left S1 lamina
may reflect some postsurgical change from posterior surgical
intervention as well.

Paraspinal and other soft tissues: Mild atrophy of the lower lumbar
paraspinal musculature. No paraspinal fluid, hemorrhage, gas or soft
tissue swelling. Included portions of the posterior abdomen are free
of acute abnormality.

Disc levels:

Level by level evaluation of the lumbar spine below:

T12-L3: No significant posterior disc abnormality, spinal canal
stenosis or neural foraminal narrowing.

L3-L4: Mild global disc bulge and minimal facet degenerative change.
No significant spinal canal stenosis or foraminal narrowing.

L4-L5: Mild global disc bulge with superimposed tiny central disc
protrusion (3/79). No significant spinal canal stenosis or foraminal
narrowing.

L5-S1: Postsurgical changes from discectomy and anterior interbody
fusion and prior posterior decompressive changes including features
of left S1 laminotomy and bone graft placement. Streak artifact from
the interbody spacer does limit some soft tissue resolution though
no significant spinal canal stenosis is clearly evident. There is
mild bilateral facet arthropathy with hypertrophic changes and
synovial thickening, left greater than right with contact of the
traversing left S1 nerve root.
IMPRESSION: 1. Postsurgical changes from L5-S1 discectomy with anterior lumbar
interbody fusion and cage placement. Additional posterior
decompressive changes likely reflecting prior left S1 laminectomy as
well.
2. L5-S1 level demonstrates mild bilateral facet arthropathy with
hypertrophic changes and synovial thickening, left greater than
right with contact of the traversing left S1 nerve root.

## 2022-08-02 ENCOUNTER — Encounter (HOSPITAL_BASED_OUTPATIENT_CLINIC_OR_DEPARTMENT_OTHER): Payer: Self-pay | Admitting: Urology

## 2022-08-02 ENCOUNTER — Other Ambulatory Visit: Payer: Self-pay

## 2022-08-02 ENCOUNTER — Emergency Department (HOSPITAL_BASED_OUTPATIENT_CLINIC_OR_DEPARTMENT_OTHER)
Admission: EM | Admit: 2022-08-02 | Discharge: 2022-08-02 | Disposition: A | Discharge status: Home / Self Care | Payer: BC Managed Care – PPO | Attending: Emergency Medicine | Admitting: Emergency Medicine

## 2022-08-02 ENCOUNTER — Emergency Department (HOSPITAL_BASED_OUTPATIENT_CLINIC_OR_DEPARTMENT_OTHER): Payer: BC Managed Care – PPO

## 2022-08-02 DIAGNOSIS — S022XXA Fracture of nasal bones, initial encounter for closed fracture: Secondary | ICD-10-CM | POA: Diagnosis not present

## 2022-08-02 DIAGNOSIS — S060XAA Concussion with loss of consciousness status unknown, initial encounter: Secondary | ICD-10-CM | POA: Diagnosis not present

## 2022-08-02 DIAGNOSIS — S0990XA Unspecified injury of head, initial encounter: Secondary | ICD-10-CM | POA: Diagnosis present

## 2022-08-02 DIAGNOSIS — W182XXA Fall in (into) shower or empty bathtub, initial encounter: Secondary | ICD-10-CM | POA: Diagnosis not present

## 2022-08-02 DIAGNOSIS — R197 Diarrhea, unspecified: Secondary | ICD-10-CM | POA: Insufficient documentation

## 2022-08-02 DIAGNOSIS — Y92002 Bathroom of unspecified non-institutional (private) residence single-family (private) house as the place of occurrence of the external cause: Secondary | ICD-10-CM | POA: Diagnosis not present

## 2022-08-02 MED ORDER — ONDANSETRON HCL 4 MG PO TABS: 4.0000 mg | ORAL_TABLET | Freq: Four times a day (QID) | ORAL | 0 refills | Status: AC

## 2022-08-02 MED ORDER — ACETAMINOPHEN 500 MG PO TABS
1000.0000 mg | ORAL_TABLET | Freq: Once | ORAL | Status: AC
  Administered 2022-08-02: 1000 mg via ORAL
  Filled 2022-08-02: qty 2

## 2022-08-02 MED ORDER — SODIUM CHLORIDE 0.9 % IV BOLUS
1000.0000 mL | Freq: Once | INTRAVENOUS | Status: AC
  Administered 2022-08-02: 1000 mL via INTRAVENOUS

## 2022-08-02 MED ORDER — METOCLOPRAMIDE HCL 5 MG/ML IJ SOLN
10.0000 mg | Freq: Once | INTRAMUSCULAR | Status: AC
  Administered 2022-08-02: 10 mg via INTRAVENOUS
  Filled 2022-08-02: qty 2

## 2022-08-02 NOTE — ED Notes (Signed)
To CT

## 2022-08-02 NOTE — Discharge Instructions (Signed)
You were seen in the emergency department for your headaches after your fall.  Your CAT scan showed no abnormalities within your brain.  You did break your right nasal bone.  You can follow-up with your ENT doctor regarding your nasal bone fracture.  He can continue to take Tylenol and Motrin as needed for headaches and Zofran as needed for nausea.  You should follow-up with your primary doctor in the next few days to have your symptoms rechecked and you can follow-up in the concussion clinic if you are having recurrent headaches after a week.  You should return to the emergency department if your headaches get significantly worse, you have numbness or weakness on one side of the body compared to the other, you have confusion or a seizure or if you have any other new or concerning symptoms.

## 2022-08-02 NOTE — ED Provider Notes (Signed)
EMERGENCY DEPARTMENT AT [facility] Provider Note   CSN: [id number] Arrival date & time: 08/02/22  1301     History  Chief Complaint  Patient presents with   Loss of Consciousness   Facial Injury    Andrea Harper is a 42 y.o. female.  Patient is a 42 year old female with a past medical history of migraines presenting to the emergency department with headaches after head injury.  The patient states that she had a GI illness last week with vomiting and diarrhea.  She states that she somehow fell in her bathroom Friday night and hit her face.  She is unsure if she passed out.  She states that she was not feeling lightheaded or dizzy or having chest pain or shortness of breath prior to falling.  She states that her vomiting and diarrhea resolved on Saturday however she has continued to have headaches.  She is to been taking her home migraine medication without any improvement.  She denies any numbness or weakness.  She reports associated photophobia.  She denies any neck pain or neck stiffness.  The history is provided by the patient and the spouse.  Loss of Consciousness Facial Injury      Home Medications Prior to Admission medications   Medication Sig Start Date End Date Taking? Authorizing Provider  ondansetron (ZOFRAN) 4 MG tablet Take 1 tablet (4 mg total) by mouth every 6 (six) hours. 08/02/22  Yes Leanord Asal K, DO  Biotin 5 MG TABS Take 5 mg by mouth daily.    [provider]  cyclobenzaprine (FLEXERIL) 10 MG tablet Take 10 mg by mouth 3 (three) times daily as needed.  Patient not taking: Reported on 01/31/2020 09/18/18   [provider]  cyclobenzaprine (FLEXERIL) 10 MG tablet Take 1 tablet (10 mg total) by mouth 3 (three) times daily as needed for muscle spasms. Patient not taking: Reported on 01/31/2020 11/22/18   Earnie Larsson, MD  Multiple Vitamin (MULTIVITAMIN WITH MINERALS) TABS tablet Take 1 tablet by mouth daily.    [provider]  oxyCODONE 10 MG TABS Take 0.5-1 tablets (5-10 mg total) by mouth every 3 (three) hours as needed for severe pain ((score 7 to 10)). Patient not taking: Reported on 01/31/2020 11/22/18   Earnie Larsson, MD  pregabalin (LYRICA) 50 MG capsule Take 50 mg by mouth 2 (two) times daily. 12/14/19   [provider]      Allergies    Erythromycin base    Review of Systems   Review of Systems  Cardiovascular:  Positive for syncope.    Physical Exam Updated Vital Signs BP 125/83 (BP Location: Left Arm)   Pulse 86   Temp 98.2 F (36.8 C) (Oral)   Resp 18   Ht '5\' 4"'$  (1.626 m)   Wt 54.4 kg   SpO2 100%   BMI 20.59 kg/m  Physical Exam Vitals and nursing note reviewed.  Constitutional:      General: She is not in acute distress.    Appearance: Normal appearance.  HENT:     Head: Normocephalic and atraumatic.     Nose:     Comments: Abrasion to bridge of nose No septal hematoma    Mouth/Throat:     Mouth: Mucous membranes are moist.     Pharynx: Oropharynx is clear.  Eyes:     Extraocular Movements: Extraocular movements intact.     Conjunctiva/sclera: Conjunctivae normal.     Pupils: Pupils are equal, round, and reactive  to light.  Cardiovascular:     Rate and Rhythm: Normal rate and regular rhythm.     Heart sounds: Normal heart sounds.  Pulmonary:     Effort: Pulmonary effort is normal.     Breath sounds: Normal breath sounds.  Abdominal:     General: Abdomen is flat.     Palpations: Abdomen is soft.     Tenderness: There is no abdominal tenderness.  Musculoskeletal:        General: Normal range of motion.     Cervical back: Normal range of motion and neck supple. No rigidity or tenderness.  Skin:    General: Skin is warm and dry.  Neurological:     General: No focal deficit present.     Mental Status: She is alert and oriented to person, place, and time.     Cranial Nerves: No cranial nerve deficit.     Sensory: No sensory deficit.     Motor: No  weakness.  Psychiatric:        Mood and Affect: Mood normal.        Behavior: Behavior normal.     ED Results / Procedures / Treatments   Labs (all labs ordered are listed, but only abnormal results are displayed) Labs Reviewed  PREGNANCY, URINE    EKG None  Radiology CT Head Wo Contrast  Result Date: 08/02/2022 CLINICAL DATA:  Head trauma, moderate-severe; Facial trauma, blunt Tech note: May have fainted in bathroom and fell Friday hitting nose. Laceration across nose. Ha today. EXAM: CT HEAD WITHOUT CONTRAST CT MAXILLOFACIAL WITHOUT CONTRAST TECHNIQUE: Multidetector CT imaging of the head and maxillofacial structures were performed using the standard protocol without intravenous contrast. Multiplanar CT image reconstructions of the maxillofacial structures were also generated. RADIATION DOSE REDUCTION: This exam was performed according to the departmental dose-optimization program which includes automated exposure control, adjustment of the mA and/or kV according to patient size and/or use of iterative reconstruction technique. COMPARISON:  None Available. FINDINGS: CT HEAD FINDINGS Brain: No evidence of acute infarction, hemorrhage, hydrocephalus, extra-axial collection or mass lesion/mass effect. Vascular: No hyperdense vessel. Skull: No acute fracture. Other: No mastoid effusions. CT MAXILLOFACIAL FINDINGS Osseous: Nondisplaced right nasal bone fracture with suspected slight overlying contusion. No other fracture identified. TMJs are located. Orbits: Negative. No traumatic or inflammatory finding. Sinuses: Clear. Soft tissues: Suspected mild nasal contusion. IMPRESSION: 1. No evidence of acute intracranial abnormality. 2. Nondisplaced right nasal bone fracture, probably acute/recent. Electronically Signed   By: Margaretha Sheffield M.D.   On: 08/02/2022 14:42   CT Maxillofacial WO CM  Result Date: 08/02/2022 CLINICAL DATA:  Head trauma, moderate-severe; Facial trauma, blunt Tech note: May  have fainted in bathroom and fell Friday hitting nose. Laceration across nose. Ha today. EXAM: CT HEAD WITHOUT CONTRAST CT MAXILLOFACIAL WITHOUT CONTRAST TECHNIQUE: Multidetector CT imaging of the head and maxillofacial structures were performed using the standard protocol without intravenous contrast. Multiplanar CT image reconstructions of the maxillofacial structures were also generated. RADIATION DOSE REDUCTION: This exam was performed according to the departmental dose-optimization program which includes automated exposure control, adjustment of the mA and/or kV according to patient size and/or use of iterative reconstruction technique. COMPARISON:  None Available. FINDINGS: CT HEAD FINDINGS Brain: No evidence of acute infarction, hemorrhage, hydrocephalus, extra-axial collection or mass lesion/mass effect. Vascular: No hyperdense vessel. Skull: No acute fracture. Other: No mastoid effusions. CT MAXILLOFACIAL FINDINGS Osseous: Nondisplaced right nasal bone fracture with suspected slight overlying contusion. No other fracture identified. TMJs are located.  Orbits: Negative. No traumatic or inflammatory finding. Sinuses: Clear. Soft tissues: Suspected mild nasal contusion. IMPRESSION: 1. No evidence of acute intracranial abnormality. 2. Nondisplaced right nasal bone fracture, probably acute/recent. Electronically Signed   By: Margaretha Sheffield M.D.   On: 08/02/2022 14:42    Procedures Procedures    Medications Ordered in ED Medications  metoCLOPramide (REGLAN) injection 10 mg (10 mg Intravenous Given 08/02/22 1411)  acetaminophen (TYLENOL) tablet 1,000 mg (1,000 mg Oral Given 08/02/22 1456)  sodium chloride 0.9 % bolus 1,000 mL (1,000 mLs Intravenous New Bag/Given 08/02/22 1410)    ED Course/ Medical Decision Making/ A&P Clinical Course as of 08/02/22 1501  Mon Aug 02, 2022  1448 CT imaging with R nasal bone fracture otherwise no acute disease. [VK]    Clinical Course User Index [VK] Kemper Durie, DO                             Medical Decision Making This patient presents to the ED with chief complaint(s) of fall, headache with pertinent past medical history of migraines which further complicates the presenting complaint. The complaint involves an extensive differential diagnosis and also carries with it a high risk of complications and morbidity.    The differential diagnosis includes ICH/mass effect, concussion, facial fracture, migraine headache, tension headache, viral syndrome, dehydration   Additional history obtained: Additional history obtained from family Records reviewed urgent care records  ED Course and Reassessment: Patient was awake and well-appearing on arrival in no acute distress.  She did receive Toradol prior to arrival and will be given Tylenol, Reglan and fluids for her headache here.  Due to patient's trauma with recurrent headaches, head CT will be performed to evaluate for ICH or mass effect as well as CT max face to evaluate for facial fracture.  She has no other signs of traumatic injury on exam.  She reports that her vomiting and diarrhea have resolved and she does not appear significantly dehydrated on exam.  Independent labs interpretation:  N/A  Independent visualization of imaging: - I independently visualized the following imaging with scope of interpretation limited to determining acute life threatening conditions related to emergency care: CTH/Max-face, which revealed R nasal bone fracture, no other traumatic injuries  Consultation: - Consulted or discussed management/test interpretation w/ external professional: N/A  Consideration for admission or further workup: Patient has no emergent conditions requiring admission or further work-up at this time and is stable for discharge home with primary care follow-up  Social Determinants of health: N/A    Amount and/or Complexity of Data Reviewed Labs: ordered. Radiology:  ordered.  Risk OTC drugs. Prescription drug management.          Final Clinical Impression(s) / ED Diagnoses Final diagnoses:  Concussion with unknown loss of consciousness status, initial encounter  Closed fracture of nasal bone, initial encounter    Rx / DC Orders ED Discharge Orders          Ordered    ondansetron (ZOFRAN) 4 MG tablet  Every 6 hours        08/02/22 1459              Kemper Durie, DO 08/02/22 1503

## 2022-08-02 NOTE — ED Triage Notes (Signed)
Pt vomited Friday and fell on way to [place] and hit her head  Unknown LOC, pt does not remember what happened Abrasion to nose  Last vomited Saturday  Light sensitivity and headache , was seen at [facility] and given shot for headache with little relief   H/o migraine

## 2023-09-05 ENCOUNTER — Encounter: Payer: Self-pay | Admitting: Neurology

## 2023-09-05 ENCOUNTER — Other Ambulatory Visit: Payer: Self-pay

## 2023-09-05 DIAGNOSIS — R202 Paresthesia of skin: Secondary | ICD-10-CM

## 2023-10-03 ENCOUNTER — Ambulatory Visit: Payer: Self-pay | Admitting: Neurology

## 2023-10-03 DIAGNOSIS — R202 Paresthesia of skin: Secondary | ICD-10-CM

## 2023-10-03 NOTE — Procedures (Signed)
 [facility]  [address] [place], [address]  [place], [place] [address] Tel: [phone] Fax: [phone] Test Date:  10/03/2023  Patient: Andrea Harper DOB: [birth year] Physician: Rommie Coats, MD  Sex: Female Height: 5\' 4"  Ref Phys: Agustina Aldrich, MD  ID#: [id number]   Technician:    History: This is a 43 year old female with numbness and tingling in her right hand, pain in right arm and shoulder.   NCV & EMG Findings: Extensive electrodiagnostic evaluation of the right upper limb shows: Right median, ulnar, radial, and median-ulnar palmar sensory responses are within normal limits. Right median (APB) and ulnar (ADM) motor responses are within normal limits. There is no evidence of active or chronic motor axon loss changes affecting any of the tested muscles on needle examination. Motor unit configuration and recruitment pattern is within normal limits.  Impression: This is a normal study. Specifically: No definitive electrodiagnostic evidence of a right cervical (C5-C8) motor radiculopathy. No electrodiagnostic evidence of a right median mononeuropathy at or distal to the wrist (ie: carpal tunnel syndrome). Screening studies for right ulnar or radial mononeuropathies are normal.    ___________________________ Rommie Coats, MD    Nerve Conduction Studies Motor Nerve Results    Latency Amplitude F-Lat Segment Distance CV Comment  Site (ms) Norm (mV) Norm (ms)  (cm) (m/s) Norm   Right Median (APB) Motor  Wrist 2.3  < 3.9 6.1  > 6.0        Elbow 6.5 - 5.9 -  Elbow-Wrist 23 55  > 50   Right Ulnar (ADM) Motor  Wrist 1.68  < 3.1 8.0  > 7.0        Bel elbow 4.2 - 7.1 -  Bel elbow-Wrist 17.5 70  > 50   Ab elbow 5.8 - 6.8 -  Ab elbow-Bel elbow 10 63 -    Sensory Sites    Neg Peak Lat Amplitude (O-P) Segment Distance Velocity Comment  Site (ms) Norm (V) Norm  (cm) (ms)   Right Median Sensory  Wrist-Dig II 2.6  < 3.4 44  > 20 Wrist-Dig II 13    Right Median-Ulnar  Palmar Sensory       Median  Palm-Wrist 1.58  < 2.2 170  > 10 Palm-Wrist 8         Ulnar  Palm-Wrist 1.40  < 2.2 71  > 5 Palm-Wrist 8    Right Radial Sensory  Forearm-Wrist 2.1  < 2.7 26  > 18 Forearm-Wrist 10    Right Ulnar Sensory  Wrist-Dig V 2.5  < 3.1 38  > 12 Wrist-Dig V 11     Inter-Nerve Comparisons   Nerve 1 Value 1 Nerve 2 Value 2 Parameter Result Normal  Sensory Sites  R Median Palm-Wrist 1.58 ms R Ulnar Palm-Wrist 1.40 ms Peak Lat Diff 0.18 ms <0.40   Electromyography   Side Muscle Ins.Act Fibs Fasc Recrt Amp Dur Poly Activation Comment  Right FDI Nml Nml Nml Nml Nml Nml Nml Nml N/A  Right EIP Nml Nml Nml Nml Nml Nml Nml Nml N/A  Right Pronator teres Nml Nml Nml Nml Nml Nml Nml Nml N/A  Right Biceps Nml Nml Nml Nml Nml Nml Nml Nml N/A  Right Triceps Nml Nml Nml Nml Nml Nml Nml Nml N/A  Right Deltoid Nml Nml Nml Nml Nml Nml Nml Nml N/A  Right C7 PSP Nml Nml Nml Nml Nml Nml Nml Nml N/A      Waveforms:  Motor  Sensory
